# Patient Record
Sex: Male | Born: 1985 | Race: White | Hispanic: No | Marital: Single | Smoking: Current every day smoker
Health system: Southern US, Community
[De-identification: ages and names within clinical notes are randomized; demographics above are authoritative.]

## PROBLEM LIST (undated history)

## (undated) DIAGNOSIS — F419 Anxiety disorder, unspecified: Secondary | ICD-10-CM

## (undated) DIAGNOSIS — K429 Umbilical hernia without obstruction or gangrene: Secondary | ICD-10-CM

---

## 2004-02-19 ENCOUNTER — Emergency Department: Payer: Self-pay | Admitting: Emergency Medicine

## 2004-03-18 ENCOUNTER — Emergency Department: Payer: Self-pay | Admitting: Internal Medicine

## 2004-04-05 ENCOUNTER — Emergency Department: Payer: Self-pay | Admitting: Emergency Medicine

## 2004-06-09 ENCOUNTER — Emergency Department: Payer: Self-pay | Admitting: Emergency Medicine

## 2004-08-10 ENCOUNTER — Emergency Department: Payer: Self-pay | Admitting: Emergency Medicine

## 2004-09-30 ENCOUNTER — Other Ambulatory Visit: Payer: Self-pay

## 2004-09-30 ENCOUNTER — Emergency Department: Payer: Self-pay | Admitting: Unknown Physician Specialty

## 2004-10-01 ENCOUNTER — Other Ambulatory Visit: Payer: Self-pay

## 2004-12-12 ENCOUNTER — Emergency Department: Payer: Self-pay | Admitting: Emergency Medicine

## 2004-12-12 ENCOUNTER — Other Ambulatory Visit: Payer: Self-pay

## 2005-01-27 ENCOUNTER — Emergency Department: Payer: Self-pay | Admitting: Emergency Medicine

## 2005-04-18 ENCOUNTER — Other Ambulatory Visit: Payer: Self-pay

## 2005-04-18 ENCOUNTER — Emergency Department: Payer: Self-pay | Admitting: Emergency Medicine

## 2005-07-12 ENCOUNTER — Emergency Department: Payer: Self-pay | Admitting: Emergency Medicine

## 2005-10-25 ENCOUNTER — Other Ambulatory Visit: Payer: Self-pay

## 2005-10-25 ENCOUNTER — Emergency Department: Payer: Self-pay | Admitting: Emergency Medicine

## 2005-11-19 ENCOUNTER — Emergency Department: Payer: Self-pay | Admitting: Emergency Medicine

## 2005-12-14 ENCOUNTER — Emergency Department: Payer: Self-pay | Admitting: Emergency Medicine

## 2006-07-13 ENCOUNTER — Emergency Department: Payer: Self-pay | Admitting: Emergency Medicine

## 2006-08-19 ENCOUNTER — Ambulatory Visit: Payer: Self-pay | Admitting: Family Medicine

## 2006-09-23 ENCOUNTER — Emergency Department: Payer: Self-pay | Admitting: Internal Medicine

## 2006-09-23 ENCOUNTER — Other Ambulatory Visit: Payer: Self-pay

## 2006-09-29 ENCOUNTER — Ambulatory Visit: Payer: Self-pay | Admitting: Internal Medicine

## 2006-10-03 ENCOUNTER — Other Ambulatory Visit: Payer: Self-pay

## 2006-10-03 ENCOUNTER — Emergency Department: Payer: Self-pay | Admitting: Internal Medicine

## 2006-10-07 ENCOUNTER — Ambulatory Visit: Payer: Self-pay | Admitting: Internal Medicine

## 2006-10-12 ENCOUNTER — Ambulatory Visit: Payer: Self-pay | Admitting: Internal Medicine

## 2006-10-20 ENCOUNTER — Emergency Department: Payer: Self-pay | Admitting: Emergency Medicine

## 2006-10-23 ENCOUNTER — Emergency Department: Payer: Self-pay | Admitting: Emergency Medicine

## 2006-11-19 ENCOUNTER — Emergency Department: Payer: Self-pay

## 2006-12-29 ENCOUNTER — Emergency Department: Payer: Self-pay | Admitting: Internal Medicine

## 2007-01-02 ENCOUNTER — Emergency Department: Payer: Self-pay | Admitting: Emergency Medicine

## 2007-01-11 ENCOUNTER — Emergency Department: Payer: Self-pay | Admitting: Emergency Medicine

## 2007-01-11 ENCOUNTER — Other Ambulatory Visit: Payer: Self-pay

## 2007-01-17 ENCOUNTER — Emergency Department: Payer: Self-pay | Admitting: Emergency Medicine

## 2007-01-20 ENCOUNTER — Emergency Department: Payer: Self-pay | Admitting: Emergency Medicine

## 2007-01-27 ENCOUNTER — Emergency Department: Payer: Self-pay | Admitting: Emergency Medicine

## 2007-01-27 ENCOUNTER — Other Ambulatory Visit: Payer: Self-pay

## 2007-01-29 ENCOUNTER — Emergency Department: Payer: Self-pay | Admitting: Emergency Medicine

## 2007-01-29 ENCOUNTER — Other Ambulatory Visit: Payer: Self-pay

## 2007-02-07 ENCOUNTER — Ambulatory Visit: Payer: Self-pay | Admitting: Family Medicine

## 2007-03-09 ENCOUNTER — Emergency Department: Payer: Self-pay | Admitting: Emergency Medicine

## 2007-03-09 ENCOUNTER — Emergency Department: Payer: Self-pay

## 2007-03-09 ENCOUNTER — Other Ambulatory Visit: Payer: Self-pay

## 2007-04-06 ENCOUNTER — Other Ambulatory Visit: Payer: Self-pay

## 2007-04-06 ENCOUNTER — Emergency Department: Payer: Self-pay | Admitting: Emergency Medicine

## 2007-04-23 ENCOUNTER — Emergency Department: Payer: Self-pay | Admitting: Emergency Medicine

## 2007-05-29 ENCOUNTER — Emergency Department: Payer: Self-pay | Admitting: Emergency Medicine

## 2007-05-29 ENCOUNTER — Other Ambulatory Visit: Payer: Self-pay

## 2007-08-02 ENCOUNTER — Emergency Department: Payer: Self-pay | Admitting: Emergency Medicine

## 2007-11-07 ENCOUNTER — Emergency Department: Payer: Self-pay | Admitting: Emergency Medicine

## 2008-01-25 DIAGNOSIS — B360 Pityriasis versicolor: Secondary | ICD-10-CM

## 2008-01-25 HISTORY — DX: Pityriasis versicolor: B36.0

## 2009-02-20 ENCOUNTER — Emergency Department: Payer: Self-pay | Admitting: Emergency Medicine

## 2009-06-08 ENCOUNTER — Emergency Department: Payer: Self-pay | Admitting: Emergency Medicine

## 2009-06-25 ENCOUNTER — Emergency Department: Payer: Self-pay | Admitting: Emergency Medicine

## 2010-06-03 ENCOUNTER — Emergency Department: Payer: Self-pay | Admitting: Emergency Medicine

## 2011-03-14 ENCOUNTER — Emergency Department: Payer: Self-pay | Admitting: Emergency Medicine

## 2011-06-28 ENCOUNTER — Emergency Department: Payer: Self-pay | Admitting: Emergency Medicine

## 2011-09-09 ENCOUNTER — Emergency Department: Payer: Self-pay | Admitting: *Deleted

## 2011-09-13 ENCOUNTER — Emergency Department: Payer: Self-pay | Admitting: Emergency Medicine

## 2011-12-04 ENCOUNTER — Emergency Department: Payer: Self-pay | Admitting: Emergency Medicine

## 2011-12-12 ENCOUNTER — Emergency Department (HOSPITAL_COMMUNITY)
Admission: EM | Admit: 2011-12-12 | Discharge: 2011-12-12 | Disposition: A | Discharge status: Home / Self Care | Payer: No Typology Code available for payment source | Attending: Emergency Medicine | Admitting: Emergency Medicine

## 2011-12-12 ENCOUNTER — Encounter (HOSPITAL_COMMUNITY): Payer: Self-pay | Admitting: Emergency Medicine

## 2011-12-12 ENCOUNTER — Emergency Department (HOSPITAL_COMMUNITY): Payer: No Typology Code available for payment source

## 2011-12-12 DIAGNOSIS — S62639A Displaced fracture of distal phalanx of unspecified finger, initial encounter for closed fracture: Secondary | ICD-10-CM | POA: Insufficient documentation

## 2011-12-12 DIAGNOSIS — Y9389 Activity, other specified: Secondary | ICD-10-CM | POA: Insufficient documentation

## 2011-12-12 DIAGNOSIS — S0100XA Unspecified open wound of scalp, initial encounter: Secondary | ICD-10-CM | POA: Insufficient documentation

## 2011-12-12 DIAGNOSIS — F17200 Nicotine dependence, unspecified, uncomplicated: Secondary | ICD-10-CM | POA: Insufficient documentation

## 2011-12-12 DIAGNOSIS — IMO0002 Reserved for concepts with insufficient information to code with codable children: Secondary | ICD-10-CM

## 2011-12-12 DIAGNOSIS — Z23 Encounter for immunization: Secondary | ICD-10-CM | POA: Insufficient documentation

## 2011-12-12 HISTORY — DX: Umbilical hernia without obstruction or gangrene: K42.9

## 2011-12-12 HISTORY — DX: Anxiety disorder, unspecified: F41.9

## 2011-12-12 MED ORDER — HYDROCODONE-ACETAMINOPHEN 5-325 MG PO TABS
1.0000 | ORAL_TABLET | Freq: Once | ORAL | Status: DC
  Filled 2011-12-12: qty 1

## 2011-12-12 MED ORDER — TETANUS-DIPHTH-ACELL PERTUSSIS 5-2.5-18.5 LF-MCG/0.5 IM SUSP
0.5000 mL | Freq: Once | INTRAMUSCULAR | Status: AC
  Administered 2011-12-12: 0.5 mL via INTRAMUSCULAR
  Filled 2011-12-12: qty 0.5

## 2011-12-12 MED ORDER — HYDROCODONE-ACETAMINOPHEN 5-325 MG PO TABS: 1.0000 | ORAL_TABLET | Freq: Four times a day (QID) | ORAL | Status: DC | PRN

## 2011-12-12 MED ORDER — IBUPROFEN 800 MG PO TABS
800.0000 mg | ORAL_TABLET | Freq: Once | ORAL | Status: DC
  Filled 2011-12-12: qty 1

## 2011-12-12 MED ORDER — NAPROXEN 500 MG PO TABS: 500.0000 mg | ORAL_TABLET | Freq: Two times a day (BID) | ORAL | Status: DC
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
## 2011-12-12 NOTE — Progress Notes (Signed)
Orthopedic Tech Progress Note Patient Details:  Shawn Leblanc [birth year] [id number]  Ortho Devices Type of Ortho Device: Finger splint Ortho Device/Splint Location: LEFT FINGER SPLINT Ortho Device/Splint Interventions: Application   Cammer, Mickie Bail 12/12/2011, 12:49 PM
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
## 2011-12-15 NOTE — ED Provider Notes (Signed)
Medical screening examination/treatment/procedure(s) were performed by non-physician practitioner and as supervising physician I was immediately available for consultation/collaboration.   Mychal Durio M Cadel Stairs, MD 12/15/11 2343 

## 2011-12-16 ENCOUNTER — Emergency Department: Payer: Self-pay | Admitting: Emergency Medicine

## 2012-04-07 ENCOUNTER — Emergency Department: Payer: Self-pay | Admitting: Emergency Medicine

## 2012-06-25 ENCOUNTER — Emergency Department: Payer: Self-pay | Admitting: Emergency Medicine

## 2012-06-25 LAB — URINALYSIS, COMPLETE
Bacteria: NONE SEEN
Ketone: NEGATIVE
Ph: 6 (ref 4.5–8.0)
RBC,UR: 1 /HPF (ref 0–5)
WBC UR: 1 /HPF (ref 0–5)

## 2012-08-25 ENCOUNTER — Emergency Department: Payer: Self-pay | Admitting: Emergency Medicine

## 2012-09-17 ENCOUNTER — Emergency Department: Payer: Self-pay | Admitting: Emergency Medicine

## 2012-09-17 LAB — BASIC METABOLIC PANEL
Calcium, Total: 9.3 mg/dL (ref 8.5–10.1)
Chloride: 106 mmol/L (ref 98–107)
Co2: 28 mmol/L (ref 21–32)
EGFR (African American): >60
Glucose: 121 mg/dL — ABNORMAL HIGH (ref 65–99)

## 2012-09-17 LAB — TROPONIN I: Troponin-I: <0.02 ng/mL

## 2012-09-17 LAB — CBC
MCH: 31.3 pg (ref 26.0–34.0)
MCHC: 35.5 g/dL (ref 32.0–36.0)
Platelet: 196 10*3/uL (ref 150–440)
WBC: 9.7 10*3/uL (ref 3.8–10.6)

## 2013-02-11 ENCOUNTER — Emergency Department: Payer: Self-pay | Admitting: Emergency Medicine

## 2013-04-21 ENCOUNTER — Emergency Department: Payer: Self-pay | Admitting: Emergency Medicine

## 2013-07-15 ENCOUNTER — Emergency Department: Payer: Self-pay | Admitting: Emergency Medicine

## 2013-07-22 ENCOUNTER — Emergency Department: Payer: Self-pay | Admitting: Emergency Medicine

## 2013-08-01 ENCOUNTER — Emergency Department: Payer: Self-pay | Admitting: Emergency Medicine

## 2013-08-20 ENCOUNTER — Emergency Department: Payer: Self-pay | Admitting: Emergency Medicine

## 2013-11-27 ENCOUNTER — Emergency Department: Payer: Self-pay | Admitting: Emergency Medicine

## 2013-11-27 LAB — CBC
HCT: 47.4 % (ref 40.0–52.0)
HGB: 16.3 g/dL (ref 13.0–18.0)
MCH: 31.4 pg (ref 26.0–34.0)
MCHC: 34.3 g/dL (ref 32.0–36.0)
MCV: 91 fL (ref 80–100)
PLATELETS: 200 10*3/uL (ref 150–440)
RBC: 5.19 10*6/uL (ref 4.40–5.90)
RDW: 13.1 % (ref 11.5–14.5)
WBC: 6.7 10*3/uL (ref 3.8–10.6)

## 2013-11-27 LAB — BASIC METABOLIC PANEL
Anion Gap: 5 — ABNORMAL LOW (ref 7–16)
BUN: 13 mg/dL (ref 7–18)
CALCIUM: 8.9 mg/dL (ref 8.5–10.1)
CHLORIDE: 106 mmol/L (ref 98–107)
CO2: 29 mmol/L (ref 21–32)
Creatinine: 1.2 mg/dL (ref 0.60–1.30)
EGFR (African American): >60
GLUCOSE: 92 mg/dL (ref 65–99)
Osmolality: 279 (ref 275–301)
Potassium: 3.5 mmol/L (ref 3.5–5.1)
Sodium: 140 mmol/L (ref 136–145)

## 2013-11-27 LAB — TROPONIN I

## 2013-12-26 ENCOUNTER — Emergency Department: Payer: Self-pay | Admitting: Emergency Medicine

## 2014-01-22 ENCOUNTER — Emergency Department: Payer: Self-pay | Admitting: Emergency Medicine

## 2014-02-01 ENCOUNTER — Emergency Department: Payer: Self-pay | Admitting: Emergency Medicine

## 2014-02-01 LAB — BASIC METABOLIC PANEL
Anion Gap: 6 — ABNORMAL LOW (ref 7–16)
BUN: 12 mg/dL (ref 7–18)
CALCIUM: 9.1 mg/dL (ref 8.5–10.1)
CO2: 26 mmol/L (ref 21–32)
CREATININE: 1.17 mg/dL (ref 0.60–1.30)
Chloride: 107 mmol/L (ref 98–107)
EGFR (African American): >60
Glucose: 96 mg/dL (ref 65–99)
Osmolality: 277 (ref 275–301)
POTASSIUM: 3.3 mmol/L — AB (ref 3.5–5.1)
SODIUM: 139 mmol/L (ref 136–145)

## 2014-02-01 LAB — CBC
HCT: 44.7 % (ref 40.0–52.0)
HGB: 15.3 g/dL (ref 13.0–18.0)
MCH: 30.3 pg (ref 26.0–34.0)
MCHC: 34.1 g/dL (ref 32.0–36.0)
MCV: 89 fL (ref 80–100)
PLATELETS: 264 10*3/uL (ref 150–440)
RBC: 5.03 10*6/uL (ref 4.40–5.90)
RDW: 13.4 % (ref 11.5–14.5)
WBC: 12.2 10*3/uL — AB (ref 3.8–10.6)

## 2014-02-01 LAB — TROPONIN I

## 2014-02-02 LAB — TROPONIN I

## 2014-02-02 LAB — D-DIMER(ARMC): D-DIMER: 83 ng/mL

## 2014-02-07 ENCOUNTER — Emergency Department: Payer: Self-pay | Admitting: Emergency Medicine

## 2014-03-16 ENCOUNTER — Emergency Department: Payer: Self-pay | Admitting: Emergency Medicine

## 2014-03-27 ENCOUNTER — Emergency Department: Payer: Self-pay | Admitting: Emergency Medicine

## 2014-04-07 ENCOUNTER — Emergency Department: Payer: Self-pay | Admitting: Student

## 2014-04-12 ENCOUNTER — Emergency Department: Payer: Self-pay | Admitting: Emergency Medicine

## 2014-05-03 ENCOUNTER — Emergency Department
Admit: 2014-05-03 | Disposition: A | Discharge status: Home / Self Care | Payer: Self-pay | Admitting: Emergency Medicine

## 2014-05-03 LAB — URINALYSIS, COMPLETE
BLOOD: NEGATIVE
Bacteria: NEGATIVE
Bilirubin,UR: NEGATIVE
Glucose,UR: NEGATIVE mg/dL (ref 0–75)
Ketone: NEGATIVE
LEUKOCYTE ESTERASE: NEGATIVE
NITRITE: NEGATIVE
PH: 6 (ref 4.5–8.0)
Protein: NEGATIVE
Specific Gravity: 1.018 (ref 1.003–1.030)

## 2014-05-03 LAB — COMPREHENSIVE METABOLIC PANEL
Albumin: 4.4 g/dL
Alkaline Phosphatase: 42 U/L
Anion Gap: 5 — ABNORMAL LOW (ref 7–16)
BILIRUBIN TOTAL: 1.3 mg/dL — AB
BUN: 13 mg/dL
CREATININE: 1.09 mg/dL
Calcium, Total: 9.4 mg/dL
Chloride: 105 mmol/L
Co2: 29 mmol/L
GLUCOSE: 84 mg/dL
Potassium: 3.4 mmol/L — ABNORMAL LOW
SGOT(AST): 19 U/L
SGPT (ALT): 13 U/L — ABNORMAL LOW
SODIUM: 139 mmol/L
TOTAL PROTEIN: 7.3 g/dL

## 2014-05-03 LAB — CBC WITH DIFFERENTIAL/PLATELET
BASOS PCT: 0.3 %
Basophil #: 0 10*3/uL (ref 0.0–0.1)
EOS ABS: 0.1 10*3/uL (ref 0.0–0.7)
Eosinophil %: 1.2 %
HCT: 45.9 % (ref 40.0–52.0)
HGB: 15.7 g/dL (ref 13.0–18.0)
LYMPHS PCT: 18.9 %
Lymphocyte #: 1.5 10*3/uL (ref 1.0–3.6)
MCH: 30.6 pg (ref 26.0–34.0)
MCHC: 34.3 g/dL (ref 32.0–36.0)
MCV: 89 fL (ref 80–100)
Monocyte #: 0.6 x10 3/mm (ref 0.2–1.0)
Monocyte %: 7.9 %
NEUTROS ABS: 5.5 10*3/uL (ref 1.4–6.5)
Neutrophil %: 71.7 %
Platelet: 193 10*3/uL (ref 150–440)
RBC: 5.14 10*6/uL (ref 4.40–5.90)
RDW: 13.2 % (ref 11.5–14.5)
WBC: 7.7 10*3/uL (ref 3.8–10.6)

## 2014-09-19 ENCOUNTER — Emergency Department
Admission: EM | Admit: 2014-09-19 | Discharge: 2014-09-19 | Disposition: A | Discharge status: Home / Self Care | Payer: Self-pay | Attending: Emergency Medicine | Admitting: Emergency Medicine

## 2014-09-19 ENCOUNTER — Emergency Department: Payer: Self-pay

## 2014-09-19 ENCOUNTER — Encounter: Payer: Self-pay | Admitting: Emergency Medicine

## 2014-09-19 DIAGNOSIS — R0789 Other chest pain: Secondary | ICD-10-CM | POA: Insufficient documentation

## 2014-09-19 DIAGNOSIS — J0110 Acute frontal sinusitis, unspecified: Secondary | ICD-10-CM | POA: Insufficient documentation

## 2014-09-19 DIAGNOSIS — Z720 Tobacco use: Secondary | ICD-10-CM | POA: Insufficient documentation

## 2014-09-19 DIAGNOSIS — J029 Acute pharyngitis, unspecified: Secondary | ICD-10-CM | POA: Insufficient documentation

## 2014-09-19 DIAGNOSIS — Z791 Long term (current) use of non-steroidal anti-inflammatories (NSAID): Secondary | ICD-10-CM | POA: Insufficient documentation

## 2014-09-19 MED ORDER — AMOXICILLIN 500 MG PO TABS: 500.0000 mg | ORAL_TABLET | Freq: Three times a day (TID) | ORAL | Status: DC

## 2014-09-19 NOTE — ED Provider Notes (Signed)
[facility] Emergency Department Provider Note ____________________________________________  Time seen: Approximately 11:33 AM  I have reviewed the triage vital signs and the nursing notes.   HISTORY  Chief Complaint Sore Throat; Chest Pain; and Facial Pain   HPI Shawn Leblanc is a 29 y.o. male who presents to the emergency department for evaluation of sinus pressure, sore throat, and a "raw feeling" in the right side of his chest when he takes a deep breath or coughs. He states that the sore throat started first and then the sinus pressure and raw feeling followed. He denies fever, nausea, vomiting, or diarrhea. He denies exposure to others who are ill.   Past Medical History  Diagnosis Date  . Anxiety   . Umbilical hernia     There are no active problems to display for this patient.   No past surgical history on file.  Current Outpatient Rx  Name  Route  Sig  Dispense  Refill  . amoxicillin (AMOXIL) 500 MG tablet   Oral   Take 1 tablet (500 mg total) by mouth 3 (three) times daily.   30 tablet   0   . clonazePAM (KLONOPIN) 0.5 MG tablet   Oral   Take 0.5 mg by mouth 2 (two) times daily as needed. For anxiety         . HYDROcodone-acetaminophen (NORCO/VICODIN) 5-325 MG per tablet   Oral   Take 1 tablet by mouth every 6 (six) hours as needed for pain.   15 tablet   0   . naproxen (NAPROSYN) 500 MG tablet   Oral   Take 1 tablet (500 mg total) by mouth 2 (two) times daily.   30 tablet   0     Allergies Review of patient's allergies indicates no known allergies.  No family history on file.  Social History Social History  Substance Use Topics  . Smoking status: Current Every Day Smoker  . Smokeless tobacco: None  . Alcohol Use: No    Review of Systems Constitutional: No fever/chills Eyes: No visual changes. ENT: Positive for sore throat. Positive for sinus pressure on the right side. Cardiovascular: Raw feeling in the  right chest. No palpitations or feeling of irregular heartbeat. Respiratory: Denies shortness of breath. Gastrointestinal: No abdominal pain.  No nausea, no vomiting.  No diarrhea.  No constipation. Genitourinary: Negative for dysuria. Musculoskeletal: Negative for back pain. Skin: Negative for rash. Neurological: Negative for headaches, focal weakness or numbness.  10-point ROS otherwise negative.  ____________________________________________   PHYSICAL EXAM:  VITAL SIGNS: ED Triage Vitals  Enc Vitals Group     BP 09/19/14 1041 130/75 mmHg     Pulse Rate 09/19/14 1041 78     Resp 09/19/14 1041 14     Temp 09/19/14 1041 98 F (36.7 C)     Temp Source 09/19/14 1041 Oral     SpO2 09/19/14 1041 99 %     Weight 09/19/14 1041 180 lb (81.647 kg)     Height 09/19/14 1041 6\' 3"  (1.905 m)     Head Cir --      Peak Flow --      Pain Score 09/19/14 1042 4     Pain Loc --      Pain Edu? --      Excl. in GC? --     Constitutional: Alert and oriented. Well appearing and in no acute distress. Eyes: Conjunctivae are normal. PERRL. EOMI. Head: Atraumatic. Nose: No congestion/rhinnorhea. Tenderness over the  frontal sinus on the right side with percussion. Mouth/Throat: Mucous membranes are moist.  White pustules noted on the right tonsil. No uvular shift or difficulty swallowing. Neck: No stridor.   Hematological/Lymphatic/Immunilogical: No cervical lymphadenopathy. Cardiovascular: Normal rate, regular rhythm. Grossly normal heart sounds.  Good peripheral circulation. Respiratory: Normal respiratory effort.  No retractions. Lungs CTAB. Gastrointestinal: Soft and nontender. No distention. No abdominal bruits. No CVA tenderness. Musculoskeletal: No lower extremity tenderness nor edema.  No joint effusions. Neurologic:  Normal speech and language. No gross focal neurologic deficits are appreciated. No gait instability. Skin:  Skin is warm, dry and intact. No rash noted. Psychiatric: Mood  and affect are normal. Speech and behavior are normal.  ____________________________________________   LABS (all labs ordered are listed, but only abnormal results are displayed)  Labs Reviewed - No data to display ____________________________________________  EKG  Not indicated ____________________________________________  RADIOLOGY  Chest x-ray shows no acute cardiopulmonary abnormality. ____________________________________________   PROCEDURES  Procedure(s) performed: None  Critical Care performed: No  ____________________________________________   INITIAL IMPRESSION / ASSESSMENT AND PLAN / ED COURSE  Pertinent labs & imaging results that were available during my care of the patient were reviewed by me and considered in my medical decision making (see chart for details).  Patient was advised to take the antibiotics until finished. He was advised to follow-up with his primary care provider for symptoms that are not improving over the next 2-3 days. He was advised to return to the emergency department for symptoms that change or worsen if he is unable schedule an appointment. ____________________________________________   FINAL CLINICAL IMPRESSION(S) / ED DIAGNOSES  Final diagnoses:  Acute pharyngitis, unspecified pharyngitis type  Acute frontal sinusitis, recurrence not specified  Chest discomfort        Chinita Pester, FNP 09/19/14 1338  Governor Rooks, MD 09/19/14 1521

## 2014-09-19 NOTE — Discharge Instructions (Signed)

## 2014-09-19 NOTE — ED Notes (Signed)
Says feels raw in throat and now it hurts down into lungs especially after smoking.  Says he also has pain in forehead from sinuses but no runny nose.

## 2014-10-14 ENCOUNTER — Emergency Department: Payer: Self-pay

## 2014-10-14 ENCOUNTER — Emergency Department
Admission: EM | Admit: 2014-10-14 | Discharge: 2014-10-14 | Disposition: A | Discharge status: Home / Self Care | Payer: Self-pay | Attending: Emergency Medicine | Admitting: Emergency Medicine

## 2014-10-14 ENCOUNTER — Encounter: Payer: Self-pay | Admitting: Emergency Medicine

## 2014-10-14 DIAGNOSIS — K029 Dental caries, unspecified: Secondary | ICD-10-CM | POA: Insufficient documentation

## 2014-10-14 DIAGNOSIS — K047 Periapical abscess without sinus: Secondary | ICD-10-CM | POA: Insufficient documentation

## 2014-10-14 DIAGNOSIS — J40 Bronchitis, not specified as acute or chronic: Secondary | ICD-10-CM | POA: Insufficient documentation

## 2014-10-14 DIAGNOSIS — Z720 Tobacco use: Secondary | ICD-10-CM | POA: Insufficient documentation

## 2014-10-14 DIAGNOSIS — Z791 Long term (current) use of non-steroidal anti-inflammatories (NSAID): Secondary | ICD-10-CM | POA: Insufficient documentation

## 2014-10-14 DIAGNOSIS — Z792 Long term (current) use of antibiotics: Secondary | ICD-10-CM | POA: Insufficient documentation

## 2014-10-14 LAB — BASIC METABOLIC PANEL
ANION GAP: 6 (ref 5–15)
BUN: 13 mg/dL (ref 6–20)
CALCIUM: 9.5 mg/dL (ref 8.9–10.3)
CO2: 28 mmol/L (ref 22–32)
Chloride: 102 mmol/L (ref 101–111)
Creatinine, Ser: 1.17 mg/dL (ref 0.61–1.24)
Glucose, Bld: 93 mg/dL (ref 65–99)
POTASSIUM: 3.7 mmol/L (ref 3.5–5.1)
SODIUM: 136 mmol/L (ref 135–145)

## 2014-10-14 LAB — CBC WITH DIFFERENTIAL/PLATELET
BASOS ABS: 0.1 10*3/uL (ref 0–0.1)
BASOS PCT: 1 %
EOS PCT: 3 %
Eosinophils Absolute: 0.2 10*3/uL (ref 0–0.7)
HCT: 45.3 % (ref 40.0–52.0)
Hemoglobin: 15.8 g/dL (ref 13.0–18.0)
LYMPHS PCT: 24 %
Lymphs Abs: 2 10*3/uL (ref 1.0–3.6)
MCH: 31.2 pg (ref 26.0–34.0)
MCHC: 34.8 g/dL (ref 32.0–36.0)
MCV: 89.7 fL (ref 80.0–100.0)
MONO ABS: 0.7 10*3/uL (ref 0.2–1.0)
Monocytes Relative: 8 %
Neutro Abs: 5.4 10*3/uL (ref 1.4–6.5)
Neutrophils Relative %: 64 %
PLATELETS: 186 10*3/uL (ref 150–440)
RBC: 5.05 MIL/uL (ref 4.40–5.90)
RDW: 13.3 % (ref 11.5–14.5)
WBC: 8.3 10*3/uL (ref 3.8–10.6)

## 2014-10-14 MED ORDER — DEXAMETHASONE SODIUM PHOSPHATE 10 MG/ML IJ SOLN
10.0000 mg | Freq: Once | INTRAMUSCULAR | Status: DC
  Filled 2014-10-14: qty 1

## 2014-10-14 MED ORDER — IOHEXOL 300 MG/ML  SOLN
100.0000 mL | Freq: Once | INTRAMUSCULAR | Status: AC | PRN
  Administered 2014-10-14: 100 mL via INTRAVENOUS

## 2014-10-14 MED ORDER — SODIUM CHLORIDE 0.9 % IV BOLUS (SEPSIS)
1000.0000 mL | INTRAVENOUS | Status: AC
  Administered 2014-10-14: 1000 mL via INTRAVENOUS

## 2014-10-14 NOTE — Discharge Instructions (Signed)
As we discussed, we believe you are symptoms or cause by bronchitis and/or the environmental exposures from your workplace.  We recommend that you stop smoking and [facility].  Return to the emergency department if he develop new or worsening symptoms that concern you.  At this time there is no specific treatment that will help you immediately although you were given a dose of Decadron 10 mg which is a steroids should help with the inflammation of your airways.  We are also providing information about local dental options given the multiple cavities and small long-term infections that were seen.  Only a dentist can help you with these issues.   Upper Respiratory Infection, Adult An upper respiratory infection (URI) is also known as the common cold. It is often caused by a type of germ (virus). Colds are easily spread (contagious). You can pass it to others by kissing, coughing, sneezing, or drinking out of the same glass. Usually, you get better in 1 or 2 weeks.  HOME CARE   Only take medicine as told by your doctor.  Use a warm mist humidifier or breathe in steam from a hot shower.  Drink enough water and fluids to keep your pee (urine) clear or pale yellow.  Get plenty of rest.  Return to work when your temperature is back to normal or as told by your doctor. You may use a face mask and wash your hands to stop your cold from spreading. GET HELP RIGHT AWAY IF:   After the first few days, you feel you are getting worse.  You have questions about your medicine.  You have chills, shortness of breath, or brown or red spit (mucus).  You have yellow or brown snot (nasal discharge) or pain in the face, especially when you bend forward.  You have a fever, puffy (swollen) neck, pain when you swallow, or white spots in the back of your throat.  You have a bad headache, ear pain, sinus pain, or chest pain.  You have a high-pitched  whistling sound when you breathe in and out (wheezing).  You have a lasting cough or cough up blood.  You have sore muscles or a stiff neck. MAKE SURE YOU:   Understand these instructions.  Will watch your condition.  Will get help right away if you are not doing well or get worse. Document Released: 06/30/2007 Document Revised: 04/05/2011 Document Reviewed: 04/18/2013 [facility] Patient Information 2015 [place], [place], [place]. This information is not intended to replace advice given to you by your health care provider. Make sure you discuss any questions you have with your health care provider.  Dental Caries Dental caries (also called tooth decay) is the most common oral disease. It can occur at any age but is more common in children and young adults.  HOW DENTAL CARIES DEVELOPS  The process of decay begins when bacteria and foods (particularly sugars and starches) combine in your mouth to produce plaque. Plaque is a substance that sticks to the hard, outer surface of a tooth (enamel). The bacteria in plaque produce acids that attack enamel. These acids may also attack the root surface of a tooth (cementum) if it is exposed. Repeated attacks dissolve these surfaces and create holes in the tooth (cavities). If left untreated, the acids destroy the other layers of the tooth.  RISK FACTORS  Frequent sipping of sugary beverages.   Frequent snacking on sugary and starchy foods, especially those that easily  get stuck in the teeth.   Poor oral hygiene.   Dry mouth.   Substance abuse such as methamphetamine abuse.   Broken or poor-fitting dental restorations.   Eating disorders.   Gastroesophageal reflux disease (GERD).   Certain radiation treatments to the head and neck. SYMPTOMS In the early stages of dental caries, symptoms are seldom present. Sometimes white, chalky areas may be seen on the enamel or other tooth layers. In later stages, symptoms may include:  Pits and holes on  the enamel.  Toothache after sweet, hot, or cold foods or drinks are consumed.  Pain around the tooth.  Swelling around the tooth. DIAGNOSIS  Most of the time, dental caries is detected during a regular dental checkup. A diagnosis is made after a thorough medical and dental history is taken and the surfaces of your teeth are checked for signs of dental caries. Sometimes special instruments, such as lasers, are used to check for dental caries. Dental X-ray exams may be taken so that areas not visible to the eye (such as between the contact areas of the teeth) can be checked for cavities.  TREATMENT  If dental caries is in its early stages, it may be reversed with a fluoride treatment or an application of a remineralizing agent at the dental office. Thorough brushing and flossing at home is needed to aid these treatments. If it is in its later stages, treatment depends on the location and extent of tooth destruction:   If a small area of the tooth has been destroyed, the destroyed area will be removed and cavities will be filled with a material such as gold, silver amalgam, or composite resin.   If a large area of the tooth has been destroyed, the destroyed area will be removed and a cap (crown) will be fitted over the remaining tooth structure.   If the center part of the tooth (pulp) is affected, a procedure called a root canal will be needed before a filling or crown can be placed.   If most of the tooth has been destroyed, the tooth may need to be pulled (extracted). HOME CARE INSTRUCTIONS You can prevent, stop, or reverse dental caries at home by practicing good oral hygiene. Good oral hygiene includes:  Thoroughly cleaning your teeth at least twice a day with a toothbrush and dental floss.   Using a fluoride toothpaste. A fluoride mouth rinse may also be used if recommended by your dentist or health care provider.   Restricting the amount of sugary and starchy foods and sugary  liquids you consume.   Avoiding frequent snacking on these foods and sipping of these liquids.   Keeping regular visits with a dentist for checkups and cleanings. PREVENTION   Practice good oral hygiene.  Consider a dental sealant. A dental sealant is a coating material that is applied by your dentist to the pits and grooves of teeth. The sealant prevents food from being trapped in them. It may protect the teeth for several years.  Ask about fluoride supplements if you live in a community without fluorinated water or with water that has a low fluoride content. Use fluoride supplements as directed by your dentist or health care provider.  Allow fluoride varnish applications to teeth if directed by your dentist or health care provider. Document Released: 10/03/2001 Document Revised: 05/28/2013 Document Reviewed: 01/14/2012 [facility] Patient Information 2015 [place], [place]. This information is not intended to replace advice given to you by your health care provider. Make sure you discuss  any questions you have with your health care provider. OPTIONS FOR DENTAL FOLLOW UP CARE  [place] Department of Health and Human Services - Local Safety Net Dental Clinics [email or web address]   [facility] [phone])  Sharl Ma [phone])  [place] [phone])  [facility] [phone])  [facility] [phone]) This clinic caters to the indigent population and is on a lottery system. Location: [employer] of Dentistry, [employer], [address], [place] Clinic Hours: Wednesdays from 6pm - 9pm, patients seen by a lottery system. For dates, call or go to [email or web address] Services: Cleanings, fillings and simple extractions. Payment Options: DENTAL WORK IS FREE OF CHARGE. Bring proof of income or support. Best way to get seen: Arrive at  5:15 pm - this is a lottery, NOT first come/first serve, so arriving earlier will not increase your chances of being seen.     [facility] [phone] Select option 1 for emergencies   Location: [facility] of Dentistry, [place], [address] [place] Clinic Hours: No walk-ins accepted - call the day before to schedule an appointment. Check in times are 9:30 am and 1:30 pm. Services: Simple extractions, temporary fillings, pulpectomy/pulp debridement, uncomplicated abscess drainage. Payment Options: PAYMENT IS DUE AT THE TIME OF SERVICE.  Fee is usually $100-200, additional surgical procedures (e.g. abscess drainage) may be extra. Cash, checks, Visa/MasterCard accepted.  Can file Medicaid if patient is covered for dental - patient should call case worker to check. No discount for [facility] patients. Best way to get seen: MUST call the day before and get onto the schedule. Can usually be seen the next 1-2 days. No walk-ins accepted.     [facility] [phone]   Location: [facility], [address], [place] Clinic Hours: M, W, Th, F 8am or 1:30pm, Tues 9a or 1:30 - first come/first served. Services: Simple extractions, temporary fillings, uncomplicated abscess drainage.  You do not need to be an [facility] resident. Payment Options: PAYMENT IS DUE AT THE TIME OF SERVICE. Dental insurance, otherwise sliding scale - bring proof of income or support. Depending on income and treatment needed, cost is usually $50-200. Best way to get seen: Arrive early as it is first come/first served.     [facility] [phone]   Location: [address] Clinic Hours: Mon-Thu 8a-5p Services: Most basic dental services including extractions and fillings. Payment Options: PAYMENT IS DUE AT THE TIME OF SERVICE. Sliding scale, up to 50% off - bring proof if  income or support. Medicaid with dental option accepted. Best way to get seen: Call to schedule an appointment, can usually be seen within 2 weeks OR they will try to see walk-ins - show up at 8a or 2p (you may have to wait).     [facility] [phone] [place] RESIDENTS ONLY   Location: [facility], [address] [place], [place] [address] Clinic Hours: By appointment only. Monday - Thursday 8am-5pm, Friday 8am-12pm Services: Cleanings, fillings, extractions. Payment Options: PAYMENT IS DUE AT THE TIME OF SERVICE. Cash, Visa or MasterCard. Sliding scale - $30 minimum per service. Best way to get seen: Come in to office, complete packet and make an appointment - need proof of income or support monies for each household member and proof of [facility] residence. Usually takes about a month to get in.     [facility]  Services Dental Clinic [phone]   Location: [address], [facility] Hours: Walk-in Urgent Care Dental Services are offered Monday-Friday mornings only. The numbers of emergencies accepted daily is limited to the number of providers available. Maximum 15 - Mondays, Wednesdays & Thursdays Maximum 10 - Tuesdays & Fridays Services: You do not need to be a [facility] resident to be seen for a dental emergency. Emergencies are defined as pain, swelling, abnormal bleeding, or dental trauma. Walkins will receive x-rays if needed. NOTE: Dental cleaning is not an emergency. Payment Options: PAYMENT IS DUE AT THE TIME OF SERVICE. Minimum co-pay is $40.00 for uninsured patients. Minimum co-pay is $3.00 for Medicaid with dental coverage. Dental Insurance is accepted and must be presented at time of visit. Medicare does not cover dental. Forms of payment: Cash, credit card, checks. Best way to get seen: If not previously registered with the clinic, walk-in dental registration begins at 7:15 am and is on a  first come/first serve basis. If previously registered with the clinic, call to make an appointment.     [facility] [phone] [place] RESIDENTS ONLY   Location: [address] [place], [place] Clinic Hours: Mon-Thu 10a-2p Services: Extractions only! Payment Options: FREE (donations accepted) - bring proof of income or support Best way to get seen: Call and schedule an appointment OR come at 8am on the 1st Monday of every month (except for holidays) when it is first come/first served.     [facility] [phone]   Location: [address] [place], [place] Clinic Hours: Friday mornings Services, Payment Options, Best way to get seen: Call for info

## 2014-10-14 NOTE — ED Notes (Signed)
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

## 2014-10-14 NOTE — ED Provider Notes (Signed)
[facility] Emergency Department Provider Note  ____________________________________________  Time seen: Approximately 4:47 PM  I have reviewed the triage vital signs and the nursing notes.   HISTORY  Chief Complaint throat pressure     HPI Shawn Leblanc is a 29 y.o. male with past medical history of anxiety and tobacco abuse who presents with discomfort in his throat and the upper right side of his chest that he describes as "my airway".  He was seen 3-4 weeks ago in the emergency department and diagnosed with acute pharyngitis and sinusitis.  He reports that the symptoms have continued in spite of taking amoxicillin.  Sometimes he feels like "my airway is closing".  He states the symptoms are worse after he works at his [employer] during the day.  He sometimes has a cough productive of clear or white sputum.  He denies fever/chills.  The shortness of breath is intermittent.  The chest discomfort he describes as tightness and located in the anterior superior medial aspect of the right side of his chest.  He further describes it as a "raw" feeling in his neck, "airway", and upper chest.  He is not having any difficulty swallowing and denies nausea/vomiting and abdominal pain.    Past Medical History  Diagnosis Date  . Anxiety   . Umbilical hernia     There are no active problems to display for this patient.   History reviewed. No pertinent past surgical history.  Current Outpatient Rx  Name  Route  Sig  Dispense  Refill  . amoxicillin (AMOXIL) 500 MG tablet   Oral   Take 1 tablet (500 mg total) by mouth 3 (three) times daily.   30 tablet   0   . clonazePAM (KLONOPIN) 0.5 MG tablet   Oral   Take 0.5 mg by mouth 2 (two) times daily as needed. For anxiety         . HYDROcodone-acetaminophen (NORCO/VICODIN) 5-325 MG per tablet   Oral   Take 1 tablet by mouth every 6 (six) hours as needed for pain.   15 tablet   0   . naproxen  (NAPROSYN) 500 MG tablet   Oral   Take 1 tablet (500 mg total) by mouth 2 (two) times daily.   30 tablet   0     Allergies Review of patient's allergies indicates no known allergies.  No family history on file.  Social History Social History  Substance Use Topics  . Smoking status: Current Every Day Smoker  . Smokeless tobacco: None  . Alcohol Use: No    Review of Systems Constitutional: No fever/chills Eyes: No visual changes. ENT: No sore throat but having "airway problems" Cardiovascular: Sharp chest pain in the anterior superior right side of his chest Respiratory: Occasional shortness of breath associated with the discomfort in his throat and upper chest Gastrointestinal: No abdominal pain.  nausea, no vomiting.  No diarrhea.  No constipation. Genitourinary: Negative for dysuria. Musculoskeletal: Negative for back pain. Skin: Negative for rash. Neurological: Negative for headaches, focal weakness or numbness.  10-point ROS otherwise negative.  ____________________________________________   PHYSICAL EXAM:  VITAL SIGNS: ED Triage Vitals  Enc Vitals Group     BP 10/14/14 1509 140/71 mmHg     Pulse Rate 10/14/14 1509 71     Resp 10/14/14 1509 18     Temp 10/14/14 1509 98.5 F (36.9 C)     Temp Source 10/14/14 1509 Oral     SpO2 10/14/14  1509 99 %     Weight 10/14/14 1508 180 lb (81.647 kg)     Height 10/14/14 1508  (1.905 m)     Head Cir --      Peak Flow --      Pain Score 10/14/14 1508 6     Pain Loc --      Pain Edu? --      Excl. in GC? --     Constitutional: Alert and oriented. Well appearing and in no acute distress. Eyes: Conjunctivae are normal. PERRL. EOMI. Head: Atraumatic. Nose: No congestion/rhinnorhea. Mouth/Throat: Mucous membranes are moist.  Oropharynx non-erythematous.  Cryptic tonsils with several tonsilloliths.  No evidence of peritonsillar abscess.  No petechiae on the palate. Neck: No stridor.  No tenderness to palpation  externally. Hematological/Lymphatic/Immunilogical: No cervical lymphadenopathy. Cardiovascular: Normal rate, regular rhythm. Grossly normal heart sounds.  Good peripheral circulation. Respiratory: Normal respiratory effort.  No retractions. Lungs CTAB. Gastrointestinal: Soft and nontender. No distention. No abdominal bruits. No CVA tenderness. Musculoskeletal: No lower extremity tenderness nor edema.  No joint effusions. Neurologic:  Normal speech and language. No gross focal neurologic deficits are appreciated.  Skin:  Skin is warm, dry and intact. No rash noted. Psychiatric: Mood and affect are normal. Speech and behavior are normal.  ____________________________________________   LABS (all labs ordered are listed, but only abnormal results are displayed)  Labs Reviewed  CBC WITH DIFFERENTIAL/PLATELET  BASIC METABOLIC PANEL   ____________________________________________  EKG  ED ECG REPORT I, FORBACH, CORY, the attending physician, personally viewed and interpreted this ECG.  Date: 10/14/2014 EKG Time: 15:07 Rate: 61 Rhythm: normal sinus rhythm QRS Axis: normal Intervals: Incomplete right bundle branch block ST/T Wave abnormalities: normal Conduction Disutrbances: none Narrative Interpretation: Tall and peaked T waves particularly in V4.  ____________________________________________  RADIOLOGY   Ct Soft Tissue Neck W Contrast  10/14/2014   CLINICAL DATA:  29 year old male with right neck pain and pressure for 2 weeks.  EXAM: CT NECK WITH CONTRAST  TECHNIQUE: Multidetector CT imaging of the neck was performed using the standard protocol following the bolus administration of intravenous contrast.  CONTRAST:  OMNIPAQUE IOHEXOL 300 MG/ML  SOLN  COMPARISON:  12/16/2011 head CT  FINDINGS: Pharynx and larynx: Unremarkable. No evidence of focal thickening, mass or abscess.  Salivary glands: Unremarkable  Thyroid: Unremarkable  Lymph nodes: No enlarged or abnormal appearing  lymph nodes.  Vascular: Unremarkable.  Limited intracranial: Unremarkable  Visualized orbits: Unremarkable  Mastoids and visualized paranasal sinuses: Unremarkable  Skeleton: Scattered caries in several teeth identified. Periapical abscesses of teeth #1, 16, 18 and 31  Upper chest: Unremarkable.  IMPRESSION: No acute abnormalities.  Dental caries and periapical abscesses as described.   Electronically Signed   By: Harmon Pier M.D.   On: 10/14/2014 19:22   Ct Chest W Contrast  10/14/2014   CLINICAL DATA:  Intermittent pressure in throat and right-sided neck. Diagnosed with pharyngitis a couple weeks ago with similar symptoms. Completed antibiotic course.  EXAM: CT CHEST WITH CONTRAST  TECHNIQUE: Multidetector CT imaging of the chest was performed during intravenous contrast administration.  CONTRAST:  OMNIPAQUE IOHEXOL 300 MG/ML  SOLN  COMPARISON:  CT 01/11/2007 and chest x-ray 09/19/2014  FINDINGS: Lungs are adequately inflated and otherwise clear. Airways are normal.  Heart is normal in size there is no mediastinal, hilar or axillary adenopathy. Remaining mediastinal structures are within normal.  Images through the upper abdomen are within normal. Remaining bones soft tissues are unchanged with  subtle curvature of the thoracic spine convex right.  IMPRESSION: No acute cardiopulmonary disease.   Electronically Signed   By: Elberta Fortis M.D.   On: 10/14/2014 19:11    ____________________________________________   PROCEDURES  Procedure(s) performed: None  Critical Care performed: No ____________________________________________   INITIAL IMPRESSION / ASSESSMENT AND PLAN / ED COURSE  Pertinent labs & imaging results that were available during my care of the patient were reviewed by me and considered in my medical decision making (see chart for details).  The patient is generally well-appearing but I am concerned about his description of persistent and possibly worsening symptoms in his  neck, throat, and upper chest in the last couple of weeks.  Given the possibility of an infectious process is tracking down from his neck into his chest, I will evaluate with a CT with IV contrast of his neck and chest.  I am also checking basic labs, particularly in the setting of his peaked T waves.  ----------------------------------------- 7:42 PM on 10/14/2014 -----------------------------------------  Other than dental caries and periapical abscesses, the patient's CT scans are unremarkable unfortunately he does not have any soft tissue infection extending down from his neck into his chest.  The patient remains stable in no acute distress in his room.  I gave him a dose of Decadron 10 mg IV and counseled him regarding smoking, bronchitis, and the environmental factors that may be contributed to his discomfort.  I am going to give [facility] and I also recommended that he see a local dentist for his multiple dental issues.  He just completed a course of amoxicillin side and does not believe it would be in his best interest to continue him on the antibiotics; he supinates to follow up with dental care.  I gave him my usual and customary return precautions. ____________________________________________  FINAL CLINICAL IMPRESSION(S) / ED DIAGNOSES  Final diagnoses:  Bronchitis  Dental caries  Periapical abscess without sinus tract      NEW MEDICATIONS STARTED DURING THIS VISIT:  New Prescriptions   No medications on file     Loleta Rose, MD 10/14/14 1948

## 2014-11-19 ENCOUNTER — Encounter: Payer: Self-pay | Admitting: Emergency Medicine

## 2014-11-19 ENCOUNTER — Emergency Department
Admission: EM | Admit: 2014-11-19 | Discharge: 2014-11-19 | Disposition: A | Discharge status: Home / Self Care | Payer: Self-pay | Attending: Emergency Medicine | Admitting: Emergency Medicine

## 2014-11-19 ENCOUNTER — Emergency Department: Payer: Self-pay

## 2014-11-19 DIAGNOSIS — Z720 Tobacco use: Secondary | ICD-10-CM | POA: Insufficient documentation

## 2014-11-19 DIAGNOSIS — Z792 Long term (current) use of antibiotics: Secondary | ICD-10-CM | POA: Insufficient documentation

## 2014-11-19 DIAGNOSIS — J4520 Mild intermittent asthma, uncomplicated: Secondary | ICD-10-CM

## 2014-11-19 DIAGNOSIS — J039 Acute tonsillitis, unspecified: Secondary | ICD-10-CM | POA: Insufficient documentation

## 2014-11-19 DIAGNOSIS — J45901 Unspecified asthma with (acute) exacerbation: Secondary | ICD-10-CM | POA: Insufficient documentation

## 2014-11-19 MED ORDER — AMOXICILLIN 500 MG PO TABS: 500.0000 mg | ORAL_TABLET | Freq: Two times a day (BID) | ORAL | Status: DC

## 2014-11-19 MED ORDER — AZITHROMYCIN 250 MG PO TABS: ORAL_TABLET | ORAL | Status: DC

## 2014-11-19 MED ORDER — ALBUTEROL SULFATE HFA 108 (90 BASE) MCG/ACT IN AERS: 2.0000 | INHALATION_SPRAY | Freq: Four times a day (QID) | RESPIRATORY_TRACT | Status: DC | PRN

## 2014-11-19 NOTE — Discharge Instructions (Signed)
How to Use an Inhaler Proper inhaler technique is very important. Good technique ensures that the medicine reaches the lungs. Poor technique results in depositing the medicine on the tongue and back of the throat rather than in the airways. If you do not use the inhaler with good technique, the medicine will not help you. STEPS TO FOLLOW IF USING AN INHALER WITHOUT AN EXTENSION TUBE  Remove the cap from the inhaler.  If you are using the inhaler for the first time, you will need to prime it. Shake the inhaler for 5 seconds and release four puffs into the air, away from your face. Ask your health care provider or pharmacist if you have questions about priming your inhaler.  Shake the inhaler for 5 seconds before each breath in (inhalation).  Position the inhaler so that the top of the canister faces up.  Put your index finger on the top of the medicine canister. Your thumb supports the bottom of the inhaler.  Open your mouth.  Either place the inhaler between your teeth and place your lips tightly around the mouthpiece, or hold the inhaler 1-2 inches away from your open mouth. If you are unsure of which technique to use, ask your health care provider.  Breathe out (exhale) normally and as completely as possible.  Press the canister down with your index finger to release the medicine.  At the same time as the canister is pressed, inhale deeply and slowly until your lungs are completely filled. This should take 4-6 seconds. Keep your tongue down.  Hold the medicine in your lungs for 5-10 seconds (10 seconds is best). This helps the medicine get into the small airways of your lungs.  Breathe out slowly, through pursed lips. Whistling is an example of pursed lips.  Wait at least 15-30 seconds between puffs. Continue with the above steps until you have taken the number of puffs your health care provider has ordered. Do not use the inhaler more than your health care provider tells  you.  Replace the cap on the inhaler.  Follow the directions from your health care provider or the inhaler insert for cleaning the inhaler. STEPS TO FOLLOW IF USING AN INHALER WITH AN EXTENSION (SPACER)  Remove the cap from the inhaler.  If you are using the inhaler for the first time, you will need to prime it. Shake the inhaler for 5 seconds and release four puffs into the air, away from your face. Ask your health care provider or pharmacist if you have questions about priming your inhaler.  Shake the inhaler for 5 seconds before each breath in (inhalation).  Place the open end of the spacer onto the mouthpiece of the inhaler.  Position the inhaler so that the top of the canister faces up and the spacer mouthpiece faces you.  Put your index finger on the top of the medicine canister. Your thumb supports the bottom of the inhaler and the spacer.  Breathe out (exhale) normally and as completely as possible.  Immediately after exhaling, place the spacer between your teeth and into your mouth. Close your lips tightly around the spacer.  Press the canister down with your index finger to release the medicine.  At the same time as the canister is pressed, inhale deeply and slowly until your lungs are completely filled. This should take 4-6 seconds. Keep your tongue down and out of the way.  Hold the medicine in your lungs for 5-10 seconds (10 seconds is best). This helps the  medicine get into the small airways of your lungs. Exhale.  Repeat inhaling deeply through the spacer mouthpiece. Again hold that breath for up to 10 seconds (10 seconds is best). Exhale slowly. If it is difficult to take this second deep breath through the spacer, breathe normally several times through the spacer. Remove the spacer from your mouth.  Wait at least 15-30 seconds between puffs. Continue with the above steps until you have taken the number of puffs your health care provider has ordered. Do not use the  inhaler more than your health care provider tells you.  Remove the spacer from the inhaler, and place the cap on the inhaler.  Follow the directions from your health care provider or the inhaler insert for cleaning the inhaler and spacer. If you are using different kinds of inhalers, use your quick relief medicine to open the airways 10-15 minutes before using a steroid if instructed to do so by your health care provider. If you are unsure which inhalers to use and the order of using them, ask your health care provider, nurse, or respiratory therapist. If you are using a steroid inhaler, always rinse your mouth with water after your last puff, then gargle and spit out the water. Do not swallow the water. AVOID:  Inhaling before or after starting the spray of medicine. It takes practice to coordinate your breathing with triggering the spray.  Inhaling through the nose (rather than the mouth) when triggering the spray. HOW TO DETERMINE IF YOUR INHALER IS FULL OR NEARLY EMPTY You cannot know when an inhaler is empty by shaking it. A few inhalers are now being made with dose counters. Ask your health care provider for a prescription that has a dose counter if you feel you need that extra help. If your inhaler does not have a counter, ask your health care provider to help you determine the date you need to refill your inhaler. Write the refill date on a calendar or your inhaler canister. Refill your inhaler 7-10 days before it runs out. Be sure to keep an adequate supply of medicine. This includes making sure it is not expired, and that you have a spare inhaler.  SEEK MEDICAL CARE IF:   Your symptoms are only partially relieved with your inhaler.  You are having trouble using your inhaler.  You have some increase in phlegm. SEEK IMMEDIATE MEDICAL CARE IF:   You feel little or no relief with your inhalers. You are still wheezing and are feeling shortness of breath or tightness in your chest or  both.  You have dizziness, headaches, or a fast heart rate.  You have chills, fever, or night sweats.  You have a noticeable increase in phlegm production, or there is blood in the phlegm. MAKE SURE YOU:   Understand these instructions.  Will watch your condition.  Will get help right away if you are not doing well or get worse.   This information is not intended to replace advice given to you by your health care provider. Make sure you discuss any questions you have with your health care provider.   Document Released: 01/09/2000 Document Revised: 11/01/2012 Document Reviewed: 08/10/2012 Elsevier Interactive Patient Education 2016 [employer]  Tonsillitis Tonsillitis is an infection of the throat. This infection causes the tonsils to become red, tender, and puffy (swollen). Tonsils are groups of tissue at the back of your throat. If bacteria caused your infection, antibiotic medicine will be given to you. Sometimes symptoms of tonsillitis can  be relieved with the use of steroid medicine. If your tonsillitis is severe and happens often, you may need to get your tonsils removed (tonsillectomy). HOME CARE   Rest and sleep often.  Drink enough fluids to keep your pee (urine) clear or pale yellow.  While your throat is sore, eat soft or liquid foods like:  Soup.  Ice cream.  Instant breakfast drinks.  Eat frozen ice pops.  Gargle with a warm or cold liquid to help soothe the throat. Gargle with a water and salt mix. Mix 1/4 teaspoon of salt and 1/4 teaspoon of baking soda in 1 cup of water.  Only take medicines as told by your doctor.  If you are given medicines (antibiotics), take them as told. Finish them even if you start to feel better. GET HELP IF:  You have large, tender lumps in your neck.  You have a rash.  You cough up green, yellow-brown, or bloody fluid.  You cannot swallow liquids or food for 24 hours.  You notice that only one of your tonsils is  swollen. GET HELP RIGHT AWAY IF:   You throw up (vomit).  You have a very bad headache.  You have a stiff neck.  You have chest pain.  You have trouble breathing or swallowing.  You have bad throat pain, drooling, or your voice changes.  You have bad pain not helped by medicine.  You cannot fully open your mouth.  You have redness, puffiness, or bad pain in the neck.  You have a fever. MAKE SURE YOU:   Understand these instructions.  Will watch your condition.  Will get help right away if you are not doing well or get worse.   This information is not intended to replace advice given to you by your health care provider. Make sure you discuss any questions you have with your health care provider.   Document Released: 06/30/2007 Document Revised: 01/16/2013 Document Reviewed: 06/30/2012 Elsevier Interactive Patient Education [employer]

## 2014-11-19 NOTE — ED Provider Notes (Signed)
[facility] Emergency Department Provider Note  ____________________________________________  Time seen: Approximately 1:27 PM  I have reviewed the triage vital signs and the nursing notes.   HISTORY  Chief Complaint Sore Throat    HPI Shawn Leblanc is a 29 y.o. male presents for evaluation of wheezing and short throat. Patient states that he is a smoker and has noticed these symptoms for the last several days. Denies any difficulty swallowing. Feels like sometimes it is hard to get a deep breath.   Past Medical History  Diagnosis Date  . Anxiety   . Umbilical hernia     There are no active problems to display for this patient.   History reviewed. No pertinent past surgical history.  Current Outpatient Rx  Name  Route  Sig  Dispense  Refill  . albuterol (PROVENTIL HFA;VENTOLIN HFA) 108 (90 BASE) MCG/ACT inhaler   Inhalation   Inhale 2 puffs into the lungs every 6 (six) hours as needed for wheezing or shortness of breath.   1 Inhaler   2   . azithromycin (ZITHROMAX Z-PAK) 250 MG tablet      Take 2 tablets (500 mg) on  Day 1,  followed by 1 tablet (250 mg) once daily on Days 2 through 5.   6 each   0   . clonazePAM (KLONOPIN) 0.5 MG tablet   Oral   Take 0.5 mg by mouth 2 (two) times daily as needed. For anxiety           Allergies Review of patient's allergies indicates no known allergies.  History reviewed. No pertinent family history.  Social History Social History  Substance Use Topics  . Smoking status: Current Every Day Smoker -- 0.50 packs/day    Types: Cigarettes  . Smokeless tobacco: None  . Alcohol Use: No    Review of Systems Constitutional: No fever/chills Eyes: No visual changes. ENT: Positive sore throat Cardiovascular: Denies chest pain. Respiratory: Occasional shortness of breath Gastrointestinal: No abdominal pain.  No nausea, no vomiting.  No diarrhea.  No constipation. Genitourinary: Negative for  dysuria. Musculoskeletal: Negative for back pain. Skin: Negative for rash. Neurological: Negative for headaches, focal weakness or numbness.  10-point ROS otherwise negative.  ____________________________________________   PHYSICAL EXAM:  VITAL SIGNS: ED Triage Vitals  Enc Vitals Group     BP 11/19/14 1230 145/67 mmHg     Pulse Rate 11/19/14 1230 83     Resp --      Temp 11/19/14 1230 97.8 F (36.6 C)     Temp Source 11/19/14 1230 Oral     SpO2 11/19/14 1230 100 %     Weight 11/19/14 1230 180 lb (81.647 kg)     Height 11/19/14 1230  (1.905 m)     Head Cir --      Peak Flow --      Pain Score 11/19/14 1233 4     Pain Loc --      Pain Edu? --      Excl. in GC? --     Constitutional: Alert and oriented. Well appearing and in no acute distress. Eyes: Conjunctivae are normal. PERRL. EOMI. Head: Atraumatic. Nose: No congestion/rhinnorhea. Mouth/Throat: Mucous membranes are moist.  Oropharynx non-erythematous. 1+ tonsillar edema with exudate on the right side.  Cardiovascular: Normal rate, regular rhythm. Grossly normal heart sounds.  Good peripheral circulation. Respiratory: Normal respiratory effort.  No retractions. Lungs CTAB. Gastrointestinal: Soft and nontender. No distention. No abdominal bruits. No CVA tenderness.  Musculoskeletal: No lower extremity tenderness nor edema.  No joint effusions. Neurologic:  Normal speech and language. No gross focal neurologic deficits are appreciated. No gait instability. Skin:  Skin is warm, dry and intact. No rash noted. Psychiatric: Mood and affect are normal. Speech and behavior are normal.  ____________________________________________   LABS (all labs ordered are listed, but only abnormal results are displayed)  Labs Reviewed - No data to display ____________________________________________  RADIOLOGY   Chest x-ray negative ____________________________________________   PROCEDURES  Procedure(s) performed:  None  Critical Care performed: No  ____________________________________________   INITIAL IMPRESSION / ASSESSMENT AND PLAN / ED COURSE  Pertinent labs & imaging results that were available during my care of the patient were reviewed by me and considered in my medical decision making (see chart for details).  Acute tonsillitis with reactive airway disease. Rx given for albuterol inhaler and Zithromax. Patient to follow up PCP or return to the ER with any worsening symptomology. Patient voices no other emergency medical complaints at this time ____________________________________________   FINAL CLINICAL IMPRESSION(S) / ED DIAGNOSES  Final diagnoses:  Tonsillitis with exudate  Reactive airway disease, mild intermittent, uncomplicated      Evangeline DakinCharles M Beers, PA-C 11/19/14 1416  Emily FilbertJonathan E Williams, MD 11/19/14 [id number]

## 2014-11-19 NOTE — ED Notes (Signed)
Pt complains of sore throat and periods of shortness of breath that worsens while smoking. Pt is in no distress at this time.

## 2015-03-08 ENCOUNTER — Emergency Department
Admission: EM | Admit: 2015-03-08 | Discharge: 2015-03-08 | Disposition: A | Discharge status: Home / Self Care | Payer: No Typology Code available for payment source | Attending: Emergency Medicine | Admitting: Emergency Medicine

## 2015-03-08 ENCOUNTER — Encounter: Payer: Self-pay | Admitting: Emergency Medicine

## 2015-03-08 DIAGNOSIS — F17210 Nicotine dependence, cigarettes, uncomplicated: Secondary | ICD-10-CM | POA: Insufficient documentation

## 2015-03-08 DIAGNOSIS — J0110 Acute frontal sinusitis, unspecified: Secondary | ICD-10-CM | POA: Insufficient documentation

## 2015-03-08 DIAGNOSIS — Z792 Long term (current) use of antibiotics: Secondary | ICD-10-CM | POA: Insufficient documentation

## 2015-03-08 DIAGNOSIS — J069 Acute upper respiratory infection, unspecified: Secondary | ICD-10-CM | POA: Insufficient documentation

## 2015-03-08 MED ORDER — FLUTICASONE PROPIONATE 50 MCG/ACT NA SUSP: 1.0000 | Freq: Every day | NASAL | Status: DC

## 2015-03-08 MED ORDER — FLUTICASONE PROPIONATE 50 MCG/ACT NA SUSP
2.0000 | Freq: Every day | NASAL | Status: DC
Start: 2015-03-08 — End: 2015-12-13

## 2015-03-08 NOTE — ED Notes (Signed)
Throat red, slightly swollen. Dental caries molars bilateral. Lungs clear. A/o. Finished antibiotics 2 days ago.

## 2015-03-08 NOTE — ED Provider Notes (Signed)
[facility] Emergency Department Provider Note ____________________________________________  Time seen: 1240  I have reviewed the triage vital signs and the nursing notes.  HISTORY  Chief Complaint  Sore Throat  HPI Shawn Leblanc is a 30 y.o. male presents to the EDfor evaluation of symptoms including sore throat and sinus pressure and congestion for the last 4 days. The patient was recently treated for a dental abscess, and completed a 10 day course of amoxicillin, 3 days prior to arrival. He denies any interim fevers, chills, or sweats. He describes general malaise, and sinus pressure without drainage. He has not dosed any over-the-counter medications for his symptoms. He reports overall discomfort at a 2/10 in triage.  Past Medical History  Diagnosis Date  . Anxiety   . Umbilical hernia     There are no active problems to display for this patient.   History reviewed. No pertinent past surgical history.  Current Outpatient Rx  Name  Route  Sig  Dispense  Refill  . albuterol (PROVENTIL HFA;VENTOLIN HFA) 108 (90 BASE) MCG/ACT inhaler   Inhalation   Inhale 2 puffs into the lungs every 6 (six) hours as needed for wheezing or shortness of breath.   1 Inhaler   2   . amoxicillin (AMOXIL) 500 MG tablet   Oral   Take 1 tablet (500 mg total) by mouth 2 (two) times daily.   20 tablet   0   . clonazePAM (KLONOPIN) 0.5 MG tablet   Oral   Take 0.5 mg by mouth 2 (two) times daily as needed. For anxiety         . fluticasone (FLONASE) 50 MCG/ACT nasal spray   Each Nare   Place 2 sprays into both nostrils daily.   16 g   0     Allergies Review of patient's allergies indicates no known allergies.  No family history on file.  Social History Social History  Substance Use Topics  . Smoking status: Current Every Day Smoker -- 0.50 packs/day    Types: Cigarettes  . Smokeless tobacco: None  . Alcohol Use: No   Review of  Systems  Constitutional: Negative for fever. Eyes: Negative for visual changes. ENT: Negative for sore throat. Reports sinus pressure. Cardiovascular: Negative for chest pain. Respiratory: Negative for shortness of breath. Gastrointestinal: Negative for abdominal pain, vomiting and diarrhea. Genitourinary: Negative for dysuria. Musculoskeletal: Negative for back pain. Skin: Negative for rash. Neurological: Negative for headaches, focal weakness or numbness. ____________________________________________  PHYSICAL EXAM:  VITAL SIGNS: ED Triage Vitals  Enc Vitals Group     BP 03/08/15 1133 141/72 mmHg     Pulse Rate 03/08/15 1133 76     Resp 03/08/15 1133 18     Temp 03/08/15 1133 98 F (36.7 C)     Temp Source 03/08/15 1133 Oral     SpO2 03/08/15 1133 99 %     Weight 03/08/15 1133 180 lb (81.647 kg)     Height 03/08/15 1133  (1.905 m)     Head Cir --      Peak Flow --      Pain Score 03/08/15 1135 2     Pain Loc --      Pain Edu? --      Excl. in GC? --    Constitutional: Alert and oriented. Well appearing and in no distress. Head: Normocephalic and atraumatic. Positive tenderness to palpation over the maxillary and frontal sinuses.      Eyes: Conjunctivae are  normal. PERRL. Normal extraocular movements      Ears: Canals clear. TMs intact bilaterally.   Nose: No congestion/rhinorrhea. Nasal turbinates are erythematous and enlarged bilaterally.   Mouth/Throat: Mucous membranes are moist. Oropharynx is erythematous, tonsils are visible, but without edema or exudate.   Neck: Supple. No thyromegaly. Hematological/Lymphatic/Immunological: No cervical lymphadenopathy. Cardiovascular: Normal rate, regular rhythm.  Respiratory: Normal respiratory effort. No wheezes/rales/rhonchi. Gastrointestinal: Soft and nontender. No distention. Musculoskeletal: Nontender with normal range of motion in all extremities.  Neurologic:  Normal gait without ataxia. Normal speech and  language. No gross focal neurologic deficits are appreciated. Skin:  Skin is warm, dry and intact. No rash noted. Psychiatric: Mood and affect are normal. Patient exhibits appropriate insight and judgment. ____________________________________________  INITIAL IMPRESSION / ASSESSMENT AND PLAN / ED COURSE  Issue with an acute rhinitis and sinusitis on presentation. No indication of any infectious process on exam. Patient recently completed amoxicillin course which would have precluded him from any possible infection due to strep. He will dose over-the-counter allergy medicine + pseudoephedrine for symptom relief. He is provided with a prescription for Flonase to dose as directed for nasal congestion. A work note is provided for today, as requested. ____________________________________________  FINAL CLINICAL IMPRESSION(S) / ED DIAGNOSES  Final diagnoses:  URI (upper respiratory infection)  Acute frontal sinusitis, recurrence not specified      Lissa Hoard, PA-C 03/08/15 1607  Rockne Menghini, MD 03/09/15 1319

## 2015-03-08 NOTE — ED Notes (Signed)
Pt states has had sore throat for approximately one week. Denies fever. Pt states he has had some sinus pressure but denies drainage.

## 2015-03-08 NOTE — ED Notes (Signed)
Sore throat, head pressure, congestion x 4 days.  Recently taking amoxicillin for dental infection, finished RX 3 days ago.

## 2015-03-08 NOTE — Discharge Instructions (Signed)
Sinus Rinse WHAT IS A SINUS RINSE? A sinus rinse is a simple home treatment that is used to rinse your sinuses with a sterile mixture of salt and water (saline solution). Sinuses are air-filled spaces in your skull behind the bones of your face and forehead that open into your nasal cavity. You will use the following:  Saline solution.  Neti pot or spray bottle. This releases the saline solution into your nose and through your sinuses. Neti pots and spray bottles can be purchased at [employer], a health food store, or online. WHEN WOULD I DO A SINUS RINSE? A sinus rinse can help to clear mucus, dirt, dust, or pollen from the nasal cavity. You may do a sinus rinse when you have a cold, a virus, nasal allergy symptoms, a sinus infection, or stuffiness in the nose or sinuses. If you are considering a sinus rinse:  Ask your child's health care provider before performing a sinus rinse on your child.  Do not do a sinus rinse if you have had ear or nasal surgery, ear infection, or blocked ears. HOW DO I DO A SINUS RINSE?  Wash your hands.  Disinfect your device according to the directions provided and then dry it.  Use the solution that comes with your device or one that is sold separately in stores. Follow the mixing directions on the package.  Fill your device with the amount of saline solution as directed by the device instructions.  Stand over a sink and tilt your head sideways over the sink.  Place the spout of the device in your upper nostril (the one closer to the ceiling).  Gently pour or squeeze the saline solution into the nasal cavity. The liquid should drain to the lower nostril if you are not overly congested.  Gently blow your nose. Blowing too hard may cause ear pain.  Repeat in the other nostril.  Clean and rinse your device with clean water and then air-dry it. ARE THERE RISKS OF A SINUS RINSE?  Sinus rinse is generally very safe and effective. However, there  are a few risks, which include:   A burning sensation in the sinuses. This may happen if you do not make the saline solution as directed. Make sure to follow all directions when making the saline solution.  Infection from contaminated water. This is rare, but possible.  Nasal irritation.   This information is not intended to replace advice given to you by your health care provider. Make sure you discuss any questions you have with your health care provider.   Document Released: 08/08/2013 Document Reviewed: 08/08/2013 Elsevier Interactive Patient Education 2016 [employer].  [employer] Vaporizers Vaporizers may help relieve the symptoms of a cough and cold. They add moisture to the air, which helps mucus to become thinner and less sticky. This makes it easier to breathe and cough up secretions. Cool mist vaporizers do not cause serious burns like hot mist vaporizers, which may also be called steamers or humidifiers. Vaporizers have not been proven to help with colds. You should not use a vaporizer if you are allergic to mold. HOME CARE INSTRUCTIONS  Follow the package instructions for the vaporizer.  Do not use anything other than distilled water in the vaporizer.  Do not run the vaporizer all of the time. This can cause mold or bacteria to grow in the vaporizer.  Clean the vaporizer after each time it is used.  Clean and dry the vaporizer well before storing it.  Stop using the vaporizer if worsening respiratory symptoms develop.   This information is not intended to replace advice given to you by your health care provider. Make sure you discuss any questions you have with your health care provider.   Document Released: 10/09/2003 Document Revised: 01/16/2013 Document Reviewed: 05/31/2012 Elsevier Interactive Patient Education 2016 [employer].  Sinusitis, Adult Sinusitis is redness, soreness, and puffiness (inflammation) of the air pockets in the bones of your face (sinuses).  The redness, soreness, and puffiness can cause air and mucus to get trapped in your sinuses. This can allow germs to grow and cause an infection.  HOME CARE   Drink enough fluids to keep your pee (urine) clear or pale yellow.  Use a humidifier in your home.  Run a hot shower to create steam in the bathroom. Sit in the bathroom with the door closed. Breathe in the steam 3-4 times a day.  Put a warm, moist washcloth on your face 3-4 times a day, or as told by your doctor.  Use salt water sprays (saline sprays) to wet the thick fluid in your nose. This can help the sinuses drain.  Only take medicine as told by your doctor. GET HELP RIGHT AWAY IF:   Your pain gets worse.  You have very bad headaches.  You are sick to your stomach (nauseous).  You throw up (vomit).  You are very sleepy (drowsy) all the time.  Your face is puffy (swollen).  Your vision changes.  You have a stiff neck.  You have trouble breathing. MAKE SURE YOU:   Understand these instructions.  Will watch your condition.  Will get help right away if you are not doing well or get worse.   This information is not intended to replace advice given to you by your health care provider. Make sure you discuss any questions you have with your health care provider.   Document Released: 06/30/2007 Document Revised: 02/01/2014 Document Reviewed: 08/17/2011 Elsevier Interactive Patient Education 2016 [employer] Inc[employer]  Upper Respiratory Infection, Adult Most upper respiratory infections (URIs) are caused by a virus. A URI affects the nose, throat, and upper air passages. The most common type of URI is often called "the common cold." HOME CARE   Take medicines only as told by your doctor.  Gargle warm saltwater or take cough drops to comfort your throat as told by your doctor.  Use a warm mist humidifier or inhale steam from a shower to increase air moisture. This may make it easier to breathe.  Drink enough fluid to  keep your pee (urine) clear or pale yellow.  Eat soups and other clear broths.  Have a healthy diet.  Rest as needed.  Go back to work when your fever is gone or your doctor says it is okay.  You may need to stay home longer to avoid giving your URI to others.  You can also wear a face mask and wash your hands often to prevent spread of the virus.  Use your inhaler more if you have asthma.  Do not use any tobacco products, including cigarettes, chewing tobacco, or electronic cigarettes. If you need help quitting, ask your doctor. GET HELP IF:  You are getting worse, not better.  Your symptoms are not helped by medicine.  You have chills.  You are getting more short of breath.  You have brown or red mucus.  You have yellow or brown discharge from your nose.  You have pain in your face, especially when  you bend forward.  You have a fever.  You have puffy (swollen) neck glands.  You have pain while swallowing.  You have white areas in the back of your throat. GET HELP RIGHT AWAY IF:   You have very bad or constant:  Headache.  Ear pain.  Pain in your forehead, behind your eyes, and over your cheekbones (sinus pain).  Chest pain.  You have long-lasting (chronic) lung disease and any of the following:  Wheezing.  Long-lasting cough.  Coughing up blood.  A change in your usual mucus.  You have a stiff neck.  You have changes in your:  Vision.  Hearing.  Thinking.  Mood. MAKE SURE YOU:   Understand these instructions.  Will watch your condition.  Will get help right away if you are not doing well or get worse.   This information is not intended to replace advice given to you by your health care provider. Make sure you discuss any questions you have with your health care provider.   Document Released: 06/30/2007 Document Revised: 05/28/2014 Document Reviewed: 04/18/2013 Elsevier Interactive Patient Education [employer]   Your  exam is essentially normal today. You have a likely viral infection. You also have some irritation and swelling of the nasal sinuses. Start a daily allergy medicine (Allegra, Claritin, Zyrtec) plus a decongestant (psuedoephedrine) for symptom relief. Follow-up with [facility] for ongoing symptoms.

## 2015-03-11 MED ORDER — ONDANSETRON HCL 4 MG/2ML IJ SOLN
INTRAMUSCULAR | Status: AC
  Filled 2015-03-11: qty 2

## 2015-03-11 MED ORDER — MORPHINE SULFATE (PF) 4 MG/ML IV SOLN
INTRAVENOUS | Status: AC
  Filled 2015-03-11: qty 1

## 2015-05-21 ENCOUNTER — Emergency Department: Payer: No Typology Code available for payment source

## 2015-05-21 ENCOUNTER — Emergency Department
Admission: EM | Admit: 2015-05-21 | Discharge: 2015-05-21 | Disposition: A | Discharge status: Home / Self Care | Payer: No Typology Code available for payment source | Attending: Emergency Medicine | Admitting: Emergency Medicine

## 2015-05-21 ENCOUNTER — Encounter: Payer: Self-pay | Admitting: *Deleted

## 2015-05-21 DIAGNOSIS — R0602 Shortness of breath: Secondary | ICD-10-CM | POA: Insufficient documentation

## 2015-05-21 DIAGNOSIS — F17210 Nicotine dependence, cigarettes, uncomplicated: Secondary | ICD-10-CM | POA: Insufficient documentation

## 2015-05-21 DIAGNOSIS — R079 Chest pain, unspecified: Secondary | ICD-10-CM

## 2015-05-21 LAB — CBC
HCT: 44.1 % (ref 40.0–52.0)
HEMOGLOBIN: 15.5 g/dL (ref 13.0–18.0)
MCH: 30.9 pg (ref 26.0–34.0)
MCHC: 35.1 g/dL (ref 32.0–36.0)
MCV: 88.1 fL (ref 80.0–100.0)
Platelets: 216 10*3/uL (ref 150–440)
RBC: 5.01 MIL/uL (ref 4.40–5.90)
RDW: 13.1 % (ref 11.5–14.5)
WBC: 10.4 10*3/uL (ref 3.8–10.6)

## 2015-05-21 LAB — BASIC METABOLIC PANEL
ANION GAP: 9 (ref 5–15)
BUN: 18 mg/dL (ref 6–20)
CALCIUM: 9.7 mg/dL (ref 8.9–10.3)
CO2: 27 mmol/L (ref 22–32)
Chloride: 104 mmol/L (ref 101–111)
Creatinine, Ser: 1.18 mg/dL (ref 0.61–1.24)
GFR calc Af Amer: >60 mL/min (ref >60)
Glucose, Bld: 101 mg/dL — ABNORMAL HIGH (ref 65–99)
Potassium: 4.3 mmol/L (ref 3.5–5.1)
Sodium: 140 mmol/L (ref 135–145)

## 2015-05-21 LAB — TROPONIN I

## 2015-05-21 LAB — FIBRIN DERIVATIVES D-DIMER (ARMC ONLY): FIBRIN DERIVATIVES D-DIMER (ARMC): 77 (ref 0–499)

## 2015-05-21 MED ORDER — LORAZEPAM 1 MG PO TABS: 1.0000 mg | ORAL_TABLET | Freq: Two times a day (BID) | ORAL | Status: DC

## 2015-05-21 NOTE — Discharge Instructions (Signed)
Nonspecific Chest Pain  °Chest pain can be caused by many different conditions. There is always a chance that your pain could be related to something serious, such as a heart attack or a blood clot in your lungs. Chest pain can also be caused by conditions that are not life-threatening. If you have chest pain, it is very important to follow up with your health care provider. °CAUSES  °Chest pain can be caused by: °· Heartburn. °· Pneumonia or bronchitis. °· Anxiety or stress. °· Inflammation around your heart (pericarditis) or lung (pleuritis or pleurisy). °· A blood clot in your lung. °· A collapsed lung (pneumothorax). It can develop suddenly on its own (spontaneous pneumothorax) or from trauma to the chest. °· Shingles infection (varicella-zoster virus). °· Heart attack. °· Damage to the bones, muscles, and cartilage that make up your chest wall. This can include: °· Bruised bones due to injury. °· Strained muscles or cartilage due to frequent or repeated coughing or overwork. °· Fracture to one or more ribs. °· Sore cartilage due to inflammation (costochondritis). °RISK FACTORS  °Risk factors for chest pain may include: °· Activities that increase your risk for trauma or injury to your chest. °· Respiratory infections or conditions that cause frequent coughing. °· Medical conditions or overeating that can cause heartburn. °· Heart disease or family history of heart disease. °· Conditions or health behaviors that increase your risk of developing a blood clot. °· Having had chicken pox (varicella zoster). °SIGNS AND SYMPTOMS °Chest pain can feel like: °· Burning or tingling on the surface of your chest or deep in your chest. °· Crushing, pressure, aching, or squeezing pain. °· Dull or sharp pain that is worse when you move, cough, or take a deep breath. °· Pain that is also felt in your back, neck, shoulder, or arm, or pain that spreads to any of these areas. °Your chest pain may come and go, or it may stay  constant. °DIAGNOSIS °Lab tests or other studies may be needed to find the cause of your pain. Your health care provider may have you take a test called an ambulatory ECG (electrocardiogram). An ECG records your heartbeat patterns at the time the test is performed. You may also have other tests, such as: °· Transthoracic echocardiogram (TTE). During echocardiography, sound waves are used to create a picture of all of the heart structures and to look at how blood flows through your heart. °· Transesophageal echocardiogram (TEE). This is a more advanced imaging test that obtains images from inside your body. It allows your health care provider to see your heart in finer detail. °· Cardiac monitoring. This allows your health care provider to monitor your heart rate and rhythm in real time. °· Holter monitor. This is a portable device that records your heartbeat and can help to diagnose abnormal heartbeats. It allows your health care provider to track your heart activity for several days, if needed. °· Stress tests. These can be done through exercise or by taking medicine that makes your heart beat more quickly. °· Blood tests. °· Imaging tests. °TREATMENT  °Your treatment depends on what is causing your chest pain. Treatment may include: °· Medicines. These may include: °· Acid blockers for heartburn. °· Anti-inflammatory medicine. °· Pain medicine for inflammatory conditions. °· Antibiotic medicine, if an infection is present. °· Medicines to dissolve blood clots. °· Medicines to treat coronary artery disease. °· Supportive care for conditions that do not require medicines. This may include: °· Resting. °· Applying heat   or cold packs to injured areas. °· Limiting activities until pain decreases. °HOME CARE INSTRUCTIONS °· If you were prescribed an antibiotic medicine, finish it all even if you start to feel better. °· Avoid any activities that bring on chest pain. °· Do not use any tobacco products, including  cigarettes, chewing tobacco, or electronic cigarettes. If you need help quitting, ask your health care provider. °· Do not drink alcohol. °· Take medicines only as directed by your health care provider. °· Keep all follow-up visits as directed by your health care provider. This is important. This includes any further testing if your chest pain does not go away. °· If heartburn is the cause for your chest pain, you may be told to keep your head raised (elevated) while sleeping. This reduces the chance that acid will go from your stomach into your esophagus. °· Make lifestyle changes as directed by your health care provider. These may include: °· Getting regular exercise. Ask your health care provider to suggest some activities that are safe for you. °· Eating a heart-healthy diet. A registered dietitian can help you to learn healthy eating options. °· Maintaining a healthy weight. °· Managing diabetes, if necessary. °· Reducing stress. °SEEK MEDICAL CARE IF: °· Your chest pain does not go away after treatment. °· You have a rash with blisters on your chest. °· You have a fever. °SEEK IMMEDIATE MEDICAL CARE IF:  °· Your chest pain is worse. °· You have an increasing cough, or you cough up blood. °· You have severe abdominal pain. °· You have severe weakness. °· You faint. °· You have chills. °· You have sudden, unexplained chest discomfort. °· You have sudden, unexplained discomfort in your arms, back, neck, or jaw. °· You have shortness of breath at any time. °· You suddenly start to sweat, or your skin gets clammy. °· You feel nauseous or you vomit. °· You suddenly feel light-headed or dizzy. °· Your heart begins to beat quickly, or it feels like it is skipping beats. °These symptoms may represent a serious problem that is an emergency. Do not wait to see if the symptoms will go away. Get medical help right away. Call your local emergency services (911 in the [place] Do not drive yourself to the hospital. °  °This  information is not intended to replace advice given to you by your health care provider. Make sure you discuss any questions you have with your health care provider. °  °Document Released: 10/21/2004 Document Revised: 02/01/2014 Document Reviewed: 08/17/2013 °Elsevier Interactive Patient Education ©2016 Elsevier Inc. ° °Panic Attacks °Panic attacks are sudden, short-lived surges of severe anxiety, fear, or discomfort. They may occur for no reason when you are relaxed, when you are anxious, or when you are sleeping. Panic attacks may occur for a number of reasons:  °· Healthy people occasionally have panic attacks in extreme, life-threatening situations, such as war or natural disasters. Normal anxiety is a protective mechanism of the body that helps us react to danger (fight or flight response). °· Panic attacks are often seen with anxiety disorders, such as panic disorder, social anxiety disorder, generalized anxiety disorder, and phobias. Anxiety disorders cause excessive or uncontrollable anxiety. They may interfere with your relationships or other life activities. °· Panic attacks are sometimes seen with other mental illnesses, such as depression and posttraumatic stress disorder. °· Certain medical conditions, prescription medicines, and drugs of abuse can cause panic attacks. °SYMPTOMS  °Panic attacks start suddenly, peak within 20 minutes, and are   accompanied by four or more of the following symptoms: °· Pounding heart or fast heart rate (palpitations). °· Sweating. °· Trembling or shaking. °· Shortness of breath or feeling smothered. °· Feeling choked. °· Chest pain or discomfort. °· Nausea or strange feeling in your stomach. °· Dizziness, light-headedness, or feeling like you will faint. °· Chills or hot flushes. °· Numbness or tingling in your lips or hands and feet. °· Feeling that things are not real or feeling that you are not yourself. °· Fear of losing control or going crazy. °· Fear of dying. °Some  of these symptoms can mimic serious medical conditions. For example, you may think you are having a heart attack. Although panic attacks can be very scary, they are not life threatening. °DIAGNOSIS  °Panic attacks are diagnosed through an assessment by your health care provider. Your health care provider will ask questions about your symptoms, such as where and when they occurred. Your health care provider will also ask about your medical history and use of alcohol and drugs, including prescription medicines. Your health care provider may order blood tests or other studies to rule out a serious medical condition. Your health care provider may refer you to a mental health professional for further evaluation. °TREATMENT  °· Most healthy people who have one or two panic attacks in an extreme, life-threatening situation will not require treatment. °· The treatment for panic attacks associated with anxiety disorders or other mental illness typically involves counseling with a mental health professional, medicine, or a combination of both. Your health care provider will help determine what treatment is best for you. °· Panic attacks due to physical illness usually go away with treatment of the illness. If prescription medicine is causing panic attacks, talk with your health care provider about stopping the medicine, decreasing the dose, or substituting another medicine. °· Panic attacks due to alcohol or drug abuse go away with abstinence. Some adults need professional help in order to stop drinking or using drugs. °HOME CARE INSTRUCTIONS  °· Take all medicines as directed by your health care provider.   °· Schedule and attend follow-up visits as directed by your health care provider. It is important to keep all your appointments. °SEEK MEDICAL CARE IF: °· You are not able to take your medicines as prescribed. °· Your symptoms do not improve or get worse. °SEEK IMMEDIATE MEDICAL CARE IF:  °· You experience panic attack  symptoms that are different than your usual symptoms. °· You have serious thoughts about hurting yourself or others. °· You are taking medicine for panic attacks and have a serious side effect. °MAKE SURE YOU: °· Understand these instructions. °· Will watch your condition. °· Will get help right away if you are not doing well or get worse. °  °This information is not intended to replace advice given to you by your health care provider. Make sure you discuss any questions you have with your health care provider. °  °Document Released: 01/11/2005 Document Revised: 01/16/2013 Document Reviewed: 08/25/2012 °Elsevier Interactive Patient Education ©2016 Elsevier Inc. ° °

## 2015-05-21 NOTE — ED Notes (Signed)
NAD noted at time of D/C. Pt denies questions or concerns. Pt ambulatory to the lobby at this time.  

## 2015-05-21 NOTE — ED Notes (Signed)
Patient transported to X-ray 

## 2015-05-21 NOTE — ED Notes (Signed)
Pt complains of right sided chest pain with shortness of breath for the last 3 days, pt denies any other symptoms

## 2015-05-21 NOTE — ED Provider Notes (Signed)
[facility] Emergency Department Provider Note        Time seen: ----------------------------------------- 4:59 PM on 05/21/2015 -----------------------------------------    I have reviewed the triage vital signs and the nursing notes.   HISTORY  Chief Complaint Chest Pain    HPI Shawn Leblanc is a 30 y.o. male who presents to ER for right-sided chest pain with shortness of breath for the past 3 days. Patient denies a history of this before, nothing makes it better or worse. Patient states the pain is dull. He denies fevers, but had recent body aches.   Past Medical History  Diagnosis Date  . Anxiety   . Umbilical hernia     There are no active problems to display for this patient.   History reviewed. No pertinent past surgical history.  Allergies Review of patient's allergies indicates no known allergies.  Social History Social History  Substance Use Topics  . Smoking status: Current Every Day Smoker -- 0.50 packs/day    Types: Cigarettes  . Smokeless tobacco: None  . Alcohol Use: No    Review of Systems Constitutional: Negative for fever. Eyes: Negative for visual changes. ENT: Negative for sore throat. Cardiovascular: Positive for chest pain Respiratory: Positive for shortness of breath Gastrointestinal: Negative for abdominal pain, vomiting and diarrhea. Genitourinary: Negative for dysuria. Musculoskeletal: Negative for back pain. Skin: Negative for rash. Neurological: Negative for headaches, focal weakness or numbness.  10-point ROS otherwise negative.  ____________________________________________   PHYSICAL EXAM:  VITAL SIGNS: ED Triage Vitals  Enc Vitals Group     BP 05/21/15 1617 130/71 mmHg     Pulse Rate 05/21/15 1617 83     Resp 05/21/15 1617 18     Temp 05/21/15 1617 98.3 F (36.8 C)     Temp Source 05/21/15 1617 Oral     SpO2 05/21/15 1617 98 %     Weight 05/21/15 1617 185 lb (83.915 kg)     Height  05/21/15 1617  (1.905 m)     Head Cir --      Peak Flow --      Pain Score --      Pain Loc --      Pain Edu? --      Excl. in GC? --    Constitutional: Alert and oriented. Well appearing and in no distress. Eyes: Conjunctivae are normal. PERRL. Normal extraocular movements. ENT   Head: Normocephalic and atraumatic.   Nose: No congestion/rhinnorhea.   Mouth/Throat: Mucous membranes are moist.   Neck: No stridor. Cardiovascular: Normal rate, regular rhythm. No murmurs, rubs, or gallops. Respiratory: Normal respiratory effort without tachypnea nor retractions. Breath sounds are clear and equal bilaterally. No wheezes/rales/rhonchi. Gastrointestinal: Soft and nontender. Normal bowel sounds Musculoskeletal: Nontender with normal range of motion in all extremities. No lower extremity tenderness nor edema. Neurologic:  Normal speech and language. No gross focal neurologic deficits are appreciated.  Skin:  Skin is warm, dry and intact. No rash noted. Psychiatric: Mood and affect are normal. Speech and behavior are normal.  ____________________________________________  EKG: Interpreted by me. Normal sinus rhythm at a rate of 76 bpm, normal PR interval, normal QRS, normal QT interval. Complete right bundle-branch block.  ____________________________________________  ED COURSE:  Pertinent labs & imaging results that were available during my care of the patient were reviewed by me and considered in my medical decision making (see chart for details). Patient is in no acute distress, will check basic cardiac labs and reevaluate. ____________________________________________  LABS (pertinent positives/negatives)  Labs Reviewed  BASIC METABOLIC PANEL - Abnormal; Notable for the following:    Glucose, Bld 101 (*)    All other components within normal limits  CBC  TROPONIN I  FIBRIN DERIVATIVES D-DIMER ([facility] ONLY)    RADIOLOGY   IMPRESSION: No active cardiopulmonary  disease.  ____________________________________________  FINAL ASSESSMENT AND PLAN  Chest pain, anxiety  Plan: Patient with labs and imaging as dictated above. Patient presents with right-sided chest pain and anxiety symptoms. No acute etiology for his symptoms are found. He is stable for outpatient follow-up with his doctor.   Emily FilbertWilliams, Jonathan E, MD   Note: This dictation was prepared with Dragon dictation. Any transcriptional errors that result from this process are unintentional   Emily FilbertJonathan E Williams, MD 05/21/15 1758

## 2015-05-25 ENCOUNTER — Emergency Department
Admission: EM | Admit: 2015-05-25 | Discharge: 2015-05-25 | Disposition: A | Discharge status: Home / Self Care | Payer: No Typology Code available for payment source | Attending: Emergency Medicine | Admitting: Emergency Medicine

## 2015-05-25 ENCOUNTER — Encounter: Payer: Self-pay | Admitting: Emergency Medicine

## 2015-05-25 DIAGNOSIS — R0789 Other chest pain: Secondary | ICD-10-CM | POA: Insufficient documentation

## 2015-05-25 DIAGNOSIS — K429 Umbilical hernia without obstruction or gangrene: Secondary | ICD-10-CM | POA: Insufficient documentation

## 2015-05-25 DIAGNOSIS — R079 Chest pain, unspecified: Secondary | ICD-10-CM

## 2015-05-25 DIAGNOSIS — F419 Anxiety disorder, unspecified: Secondary | ICD-10-CM | POA: Insufficient documentation

## 2015-05-25 DIAGNOSIS — F17210 Nicotine dependence, cigarettes, uncomplicated: Secondary | ICD-10-CM | POA: Insufficient documentation

## 2015-05-25 LAB — CBC
HCT: 44.2 % (ref 40.0–52.0)
Hemoglobin: 15.1 g/dL (ref 13.0–18.0)
MCH: 30.9 pg (ref 26.0–34.0)
MCHC: 34.2 g/dL (ref 32.0–36.0)
MCV: 90.4 fL (ref 80.0–100.0)
PLATELETS: 194 10*3/uL (ref 150–440)
RBC: 4.89 MIL/uL (ref 4.40–5.90)
RDW: 13.1 % (ref 11.5–14.5)
WBC: 10.5 10*3/uL (ref 3.8–10.6)

## 2015-05-25 LAB — COMPREHENSIVE METABOLIC PANEL
ALK PHOS: 40 U/L (ref 38–126)
ALT: 12 U/L — AB (ref 17–63)
AST: 17 U/L (ref 15–41)
Albumin: 4.4 g/dL (ref 3.5–5.0)
Anion gap: 7 (ref 5–15)
BILIRUBIN TOTAL: 1.6 mg/dL — AB (ref 0.3–1.2)
BUN: 16 mg/dL (ref 6–20)
CALCIUM: 9.3 mg/dL (ref 8.9–10.3)
CO2: 27 mmol/L (ref 22–32)
CREATININE: 1.1 mg/dL (ref 0.61–1.24)
Chloride: 105 mmol/L (ref 101–111)
GFR calc non Af Amer: >60 mL/min (ref >60)
Glucose, Bld: 86 mg/dL (ref 65–99)
Potassium: 4 mmol/L (ref 3.5–5.1)
Sodium: 139 mmol/L (ref 135–145)
Total Protein: 7.2 g/dL (ref 6.5–8.1)

## 2015-05-25 LAB — TROPONIN I

## 2015-05-25 NOTE — Discharge Instructions (Signed)
Hernia A hernia happens when an organ or tissue inside your body pushes out through a weak spot in the belly (abdomen). HOME CARE  Avoid stretching or overusing (straining) the muscles near the hernia.  Do not lift anything heavier than 10 lb (4.5 kg).  Use the muscles in your leg when you lift something up. Do not use the muscles in your back.  When you cough, try to cough gently.  Eat a diet that has a lot of fiber. Eat lots of fruits and vegetables.  Drink enough fluids to keep your pee (urine) clear or pale yellow. Try to drink 6-8 glasses of water a day.  Take medicines to make your poop soft (stool softeners) as told by your doctor.  Lose weight, if you are overweight.  Do not use any tobacco products, including cigarettes, chewing tobacco, or electronic cigarettes. If you need help quitting, ask your doctor.  Keep all follow-up visits as told by your doctor. This is important. GET HELP IF:  The skin by the hernia gets puffy (swollen) or red.  The hernia is painful. GET HELP RIGHT AWAY IF:  You have a fever.  You have belly pain that is getting worse.  You feel sick to your stomach (nauseous) or you throw up (vomit).  You cannot push the hernia back in place by gently pressing on it while you are lying down.  The hernia:  Changes in shape or size.  Is stuck outside your belly.  Changes color.  Feels hard or tender.   This information is not intended to replace advice given to you by your health care provider. Make sure you discuss any questions you have with your health care provider.   Document Released: 07/01/2009 Document Revised: 02/01/2014 Document Reviewed: 11/21/2013 Elsevier Interactive Patient Education 2016 Else[employer] Inc[employer]  Nonspecific Chest Pain It is often hard to find the cause of chest pain. There is always a chance that your pain could be related to something serious, such as a heart attack or a blood clot in your lungs. Chest pain can also be  caused by conditions that are not life-threatening. If you have chest pain, it is very important to follow up with your doctor.  HOME CARE  If you were prescribed an antibiotic medicine, finish it all even if you start to feel better.  Avoid any activities that cause chest pain.  Do not use any tobacco products, including cigarettes, chewing tobacco, or electronic cigarettes. If you need help quitting, ask your doctor.  Do not drink alcohol.  Take medicines only as told by your doctor.  Keep all follow-up visits as told by your doctor. This is important. This includes any further testing if your chest pain does not go away.  Your doctor may tell you to keep your head raised (elevated) while you sleep.  Make lifestyle changes as told by your doctor. These may include:  Getting regular exercise. Ask your doctor to suggest some activities that are safe for you.  Eating a heart-healthy diet. Your doctor or a diet specialist (dietitian) can help you to learn healthy eating options.  Maintaining a healthy weight.  Managing diabetes, if necessary.  Reducing stress. GET HELP IF:  Your chest pain does not go away, even after treatment.  You have a rash with blisters on your chest.  You have a fever. GET HELP RIGHT AWAY IF:  Your chest pain is worse.  You have an increasing cough, or you cough up blood.  You  have severe belly (abdominal) pain.  You feel extremely weak.  You pass out (faint).  You have chills.  You have sudden, unexplained chest discomfort.  You have sudden, unexplained discomfort in your arms, back, neck, or jaw.  You have shortness of breath at any time.  You suddenly start to sweat, or your skin gets clammy.  You feel nauseous.  You vomit.  You suddenly feel light-headed or dizzy.  Your heart begins to beat quickly, or it feels like it is skipping beats. These symptoms may be an emergency. Do not wait to see if the symptoms will go away. Get  medical help right away. Call your local emergency services (911 in the [place] Do not drive yourself to the hospital.   This information is not intended to replace advice given to you by your health care provider. Make sure you discuss any questions you have with your health care provider.   Document Released: 06/30/2007 Document Revised: 02/01/2014 Document Reviewed: 08/17/2013 Elsevier Interactive Patient Education [employer] Elsevier Inc.

## 2015-05-25 NOTE — ED Notes (Signed)
E-signature box not working. Pt verbalized understanding of discharge instructions and denied questions. 

## 2015-05-25 NOTE — ED Provider Notes (Signed)
[facility] Emergency Department Provider Note  ____________________________________________    I have reviewed the triage vital signs and the nursing notes.   HISTORY  Chief Complaint Chest Pain and Headache    HPI Shawn Leblanc is a 30 y.o. male who presents with several complaints. He notes that earlier while he was at work he felt dizzy but notes that has resolved. He denies syncope. He also complains of continued discomfort in the right lower chest. He was seen in the emergency department several days ago for similar symptoms and had a normal workup. Finally he reports he had a headache yesterday which is also resolved and has not had one today. He also reports that he has had an umbilical hernia since 2013 which has been intermittently causing him discomfort but not currently.     Past Medical History  Diagnosis Date  . Anxiety   . Umbilical hernia     There are no active problems to display for this patient.   History reviewed. No pertinent past surgical history.  Current Outpatient Rx  Name  Route  Sig  Dispense  Refill  . albuterol (PROVENTIL HFA;VENTOLIN HFA) 108 (90 BASE) MCG/ACT inhaler   Inhalation   Inhale 2 puffs into the lungs every 6 (six) hours as needed for wheezing or shortness of breath.   1 Inhaler   2   . amoxicillin (AMOXIL) 500 MG tablet   Oral   Take 1 tablet (500 mg total) by mouth 2 (two) times daily.   20 tablet   0   . clonazePAM (KLONOPIN) 0.5 MG tablet   Oral   Take 0.5 mg by mouth 2 (two) times daily as needed. For anxiety         . fluticasone (FLONASE) 50 MCG/ACT nasal spray   Each Nare   Place 2 sprays into both nostrils daily.   16 g   0   . LORazepam (ATIVAN) 1 MG tablet   Oral   Take 1 tablet (1 mg total) by mouth 2 (two) times daily.   20 tablet   0     Allergies Review of patient's allergies indicates no known allergies.  History reviewed. No pertinent family history.  Social  History Social History  Substance Use Topics  . Smoking status: Current Every Day Smoker -- 0.50 packs/day    Types: Cigarettes  . Smokeless tobacco: None  . Alcohol Use: No    Review of Systems  Constitutional: Negative for fever. Eyes: Negative for redness ENT: Negative for sore throat Cardiovascular: As above Respiratory: Negative for shortness of breath. Gastrointestinal: Umbilical hernia as above, no nausea vomiting or diarrhea Genitourinary: Negative for dysuria. Musculoskeletal: Negative for back pain. Skin: Negative for rash. Neurological: Negative for focal weakness Psychiatric: Positive for anxiety    ____________________________________________   PHYSICAL EXAM:  VITAL SIGNS: ED Triage Vitals  Enc Vitals Group     BP 05/25/15 1411 134/58 mmHg     Pulse Rate 05/25/15 1411 73     Resp 05/25/15 1411 16     Temp 05/25/15 1411 99.1 F (37.3 C)     Temp Source 05/25/15 1411 Oral     SpO2 05/25/15 1411 98 %     Weight 05/25/15 1411 185 lb (83.915 kg)     Height 05/25/15 1411  (1.905 m)     Head Cir --      Peak Flow --      Pain Score 05/25/15 1412 0  Pain Loc --      Pain Edu? --      Excl. in GC? --     Constitutional: Alert and oriented. Well appearing and in no distress.  Eyes: Conjunctivae are normal. No erythema or injection ENT   Head: Normocephalic and atraumatic.   Mouth/Throat: Mucous membranes are moist. Cardiovascular: Normal rate, regular rhythm. Normal and symmetric distal pulses are present in the upper extremities. No murmurs or rubs . No chest wall tenderness to palpation Respiratory: Normal respiratory effort without tachypnea nor retractions. Breath sounds are clear and equal bilaterally.  Gastrointestinal: Soft and non-tender in all quadrants. No distention. There is no CVA tenderness. Umbilical hernia is soft and nontender no erythema Genitourinary: deferred Musculoskeletal: Nontender with normal range of motion in all  extremities. No lower extremity tenderness nor edema. Neurologic:  Normal speech and language. No gross focal neurologic deficits are appreciated. Skin:  Skin is warm, dry and intact. No rash noted. Psychiatric: Mood and affect are normal. Patient exhibits appropriate insight and judgment.  ____________________________________________    LABS (pertinent positives/negatives)  Labs Reviewed  COMPREHENSIVE METABOLIC PANEL  CBC  TROPONIN I    ____________________________________________   EKG  ED ECG REPORT I, Jene EveryKINNER, Laiklynn Raczynski, the attending physician, personally viewed and interpreted this ECG.  Date: 05/25/2015 EKG Time: 2:21 PM Rate: 74 Rhythm: normal sinus rhythm QRS Axis: normal Intervals: normal ST/T Wave abnormalities: normal Conduction Disturbances: Incomplete right bundle branch block Narrative Interpretation: Unchanged from prior EKG   ____________________________________________    RADIOLOGY  None  ____________________________________________   PROCEDURES  Procedure(s) performed: none  Critical Care performed: none  ____________________________________________   INITIAL IMPRESSION / ASSESSMENT AND PLAN / ED COURSE  Pertinent labs & imaging results that were available during my care of the patient were reviewed by me and considered in my medical decision making (see chart for details).  Patient presents with multiple complaints. We will recheck labs, including troponin CMP CBC. EKG is unchanged. Negative d-dimer 4 days ago. No pleurisy. Patient is not followed up as an outpatient. Abdominal hernia is nontender nor swollen. Headache occurred yesterday and not today. Dizziness has resolved, vitals are unremarkable.  Lab work is unremarkable. Patient is well-appearing. Strongly encouraged outpatient follow-up. We did discuss return precautions.  ____________________________________________   FINAL CLINICAL IMPRESSION(S) / ED DIAGNOSES  Final  diagnoses:  Chest pain, unspecified chest pain type  Umbilical hernia without obstruction and without gangrene          Jene Everyobert Jayln Madeira, MD 05/25/15 1843

## 2015-05-25 NOTE — ED Notes (Signed)
Pt presents to ED with recurrent chest pain on right lower rib area that hurts when he breathes in . Pt states he feels unbalanced especially when at work. Alert and oriented x4

## 2015-11-12 ENCOUNTER — Emergency Department
Admission: EM | Admit: 2015-11-12 | Discharge: 2015-11-12 | Disposition: A | Discharge status: Home / Self Care | Payer: Self-pay | Attending: Student in an Organized Health Care Education/Training Program | Admitting: Student in an Organized Health Care Education/Training Program

## 2015-11-12 ENCOUNTER — Emergency Department: Payer: Self-pay

## 2015-11-12 ENCOUNTER — Encounter: Payer: Self-pay | Admitting: Medical Oncology

## 2015-11-12 DIAGNOSIS — Z79899 Other long term (current) drug therapy: Secondary | ICD-10-CM | POA: Insufficient documentation

## 2015-11-12 DIAGNOSIS — B9789 Other viral agents as the cause of diseases classified elsewhere: Secondary | ICD-10-CM

## 2015-11-12 DIAGNOSIS — J069 Acute upper respiratory infection, unspecified: Secondary | ICD-10-CM | POA: Insufficient documentation

## 2015-11-12 DIAGNOSIS — J029 Acute pharyngitis, unspecified: Secondary | ICD-10-CM

## 2015-11-12 DIAGNOSIS — F17210 Nicotine dependence, cigarettes, uncomplicated: Secondary | ICD-10-CM | POA: Insufficient documentation

## 2015-11-12 DIAGNOSIS — M940 Chondrocostal junction syndrome [Tietze]: Secondary | ICD-10-CM | POA: Insufficient documentation

## 2015-11-12 MED ORDER — PROMETHAZINE-DM 6.25-15 MG/5ML PO SYRP: 5.0000 mL | ORAL_SOLUTION | Freq: Four times a day (QID) | ORAL | 0 refills | Status: DC | PRN

## 2015-11-12 MED ORDER — LIDOCAINE VISCOUS 2 % MT SOLN: 5.0000 mL | Freq: Four times a day (QID) | OROMUCOSAL | 0 refills | Status: DC | PRN

## 2015-11-12 MED ORDER — NAPROXEN 500 MG PO TABS: 500.0000 mg | ORAL_TABLET | Freq: Two times a day (BID) | ORAL | Status: DC

## 2015-11-12 NOTE — ED Notes (Signed)
Pt states understanding of discharge instructions. NAD noted at this time.  

## 2015-11-12 NOTE — ED Notes (Signed)
Sore throat, pain with swallowing X 1 week. Pt alert and oriented X4, active, cooperative, pt in NAD. RR even and unlabored, color WNL.

## 2015-11-12 NOTE — ED Triage Notes (Signed)
Pt reports that his throat has felt "raw" for the past week with cold sx's and cough.

## 2015-11-12 NOTE — ED Provider Notes (Signed)
[facility] Emergency Department Provider Note   ____________________________________________   First MD Initiated Contact with Patient 11/12/15 1436     (approximate)  I have reviewed the triage vital signs and the nursing notes.   HISTORY  Chief Complaint Sore Throat and Cough    HPI Shawn Leblanc is a 30 y.o. male patient complain of chest wall pain, sore throat, and productive cough 1 week. Patient's redness pain is a 5/10. No palliative measures taken for this complaint. Patient describes his pain as "achy".   Past Medical History:  Diagnosis Date  . Anxiety   . Umbilical hernia     There are no active problems to display for this patient.   History reviewed. No pertinent surgical history.  Prior to Admission medications   Medication Sig Start Date End Date Taking? Authorizing Provider  albuterol (PROVENTIL HFA;VENTOLIN HFA) 108 (90 BASE) MCG/ACT inhaler Inhale 2 puffs into the lungs every 6 (six) hours as needed for wheezing or shortness of breath. 11/19/14   Charmayne Sheerharles M Beers, PA-C  amoxicillin (AMOXIL) 500 MG tablet Take 1 tablet (500 mg total) by mouth 2 (two) times daily. 11/19/14   Charmayne Sheerharles M Beers, PA-C  clonazePAM (KLONOPIN) 0.5 MG tablet Take 0.5 mg by mouth 2 (two) times daily as needed. For anxiety    Historical Provider, MD  fluticasone (FLONASE) 50 MCG/ACT nasal spray Place 2 sprays into both nostrils daily. 03/08/15   Jenise V Bacon Menshew, PA-C  lidocaine (XYLOCAINE) 2 % solution Use as directed 5 mLs in the mouth or throat every 6 (six) hours as needed for mouth pain. 5 ML's 11/12/15   Joni Reiningonald K Gloristine Turrubiates, PA-C  LORazepam (ATIVAN) 1 MG tablet Take 1 tablet (1 mg total) by mouth 2 (two) times daily. 05/21/15 05/20/16  Emily FilbertJonathan E Williams, MD  naproxen (NAPROSYN) 500 MG tablet Take 1 tablet (500 mg total) by mouth 2 (two) times daily with a meal. 11/12/15   Joni Reiningonald K Luvern Mischke, PA-C  promethazine-dextromethorphan (PROMETHAZINE-DM) 6.25-15  MG/5ML syrup Take 5 mLs by mouth 4 (four) times daily as needed for cough. Mixed with 5 mL of viscous lidocaine swish and swallow. 11/12/15   Joni Reiningonald K Jedadiah Abdallah, PA-C    Allergies Review of patient's allergies indicates no known allergies.  No family history on file.  Social History Social History  Substance Use Topics  . Smoking status: Current Every Day Smoker    Packs/day: 0.50    Types: Cigarettes  . Smokeless tobacco: Not on file  . Alcohol use No    Review of Systems Constitutional: No fever/chills Eyes: No visual changes. ENT: No throat Cardiovascular: Respiratory: Denies shortness of breath. 4. Nonproductive cough Gastrointestinal: No abdominal pain.  No nausea, no vomiting.  No diarrhea.  No constipation. Genitourinary: Negative for dysuria. Musculoskeletal: Anterior chest wall pain Skin: Negative for rash. Neurological: Negative for headaches, focal weakness or numbness.    ____________________________________________   PHYSICAL EXAM:  VITAL SIGNS: ED Triage Vitals  Enc Vitals Group     BP 11/12/15 1411 (!) 151/61     Pulse Rate 11/12/15 1411 78     Resp 11/12/15 1411 16     Temp 11/12/15 1411 98.1 F (36.7 C)     Temp Source 11/12/15 1411 Oral     SpO2 11/12/15 1411 99 %     Weight 11/12/15 1411 185 lb (83.9 kg)     Height 11/12/15 1411 6\' 3"  (1.905 m)     Head Circumference --  Peak Flow --      Pain Score 11/12/15 1412 5     Pain Loc --      Pain Edu? --      Excl. in GC? --     Constitutional: Alert and oriented. Well appearing and in no acute distress. Eyes: Conjunctivae are normal. PERRL. EOMI. Head: Atraumatic. Nose: No congestion/rhinnorhea. Mouth/Throat: Mucous membranes are moist.  Oropharynx non-erythematous. Neck: No stridor. No cervical spine tenderness to palpation. Hematological/Lymphatic/Immunilogical: No cervical lymphadenopathy. Cardiovascular: Normal rate, regular rhythm. Grossly normal heart sounds.  Good peripheral  circulation. Respiratory: Normal respiratory effort.  No retractions. Lungs CTAB. Gastrointestinal: Soft and nontender. No distention. No abdominal bruits. No CVA tenderness. Musculoskeletal: No lower extremity tenderness nor edema.  No joint effusions. Neurologic:  Normal speech and language. No gross focal neurologic deficits are appreciated. No gait instability. Skin:  Skin is warm, dry and intact. No rash noted. Psychiatric: Mood and affect are normal. Speech and behavior are normal.  ____________________________________________   LABS (all labs ordered are listed, but only abnormal results are displayed)  Labs Reviewed - No data to display ____________________________________________  EKG   ____________________________________________  RADIOLOGY  No acute findings on chest x-ray. ____________________________________________   PROCEDURES  Procedure(s) performed: None  Procedures  Critical Care performed: No  ____________________________________________   INITIAL IMPRESSION / ASSESSMENT AND PLAN / ED COURSE  Pertinent labs & imaging results that were available during my care of the patient were reviewed by me and considered in my medical decision making (see chart for details).  Costochondritis secondary to cough. Discussed neck x-ray finding with patient. Patient was lidocaine given discharge care instructions. Patient given a prescription for Phenergan DM and ibuprofen. Patient advised [facility] condition [facility].  Clinical Course     ____________________________________________   FINAL CLINICAL IMPRESSION(S) / ED DIAGNOSES  Final diagnoses:  Sore throat  Viral URI with cough  Costochondritis      NEW MEDICATIONS STARTED DURING THIS VISIT:  New Prescriptions   LIDOCAINE (XYLOCAINE) 2 % SOLUTION    Use as directed 5 mLs in the mouth or throat every 6 (six) hours as needed for mouth pain. 5 ML's   NAPROXEN (NAPROSYN) 500 MG  TABLET    Take 1 tablet (500 mg total) by mouth 2 (two) times daily with a meal.   PROMETHAZINE-DEXTROMETHORPHAN (PROMETHAZINE-DM) 6.25-15 MG/5ML SYRUP    Take 5 mLs by mouth 4 (four) times daily as needed for cough. Mixed with 5 mL of viscous lidocaine swish and swallow.     Note:  This document was prepared using Dragon voice recognition software and may include unintentional dictation errors.    Joni Reining, PA-C 11/12/15 1609    Willy Eddy, MD 11/12/15 [phone]

## 2015-12-04 ENCOUNTER — Emergency Department
Admission: EM | Admit: 2015-12-04 | Discharge: 2015-12-04 | Disposition: A | Discharge status: Home / Self Care | Payer: Self-pay | Attending: Emergency Medicine | Admitting: Emergency Medicine

## 2015-12-04 ENCOUNTER — Encounter: Payer: Self-pay | Admitting: Emergency Medicine

## 2015-12-04 DIAGNOSIS — Z79899 Other long term (current) drug therapy: Secondary | ICD-10-CM | POA: Insufficient documentation

## 2015-12-04 DIAGNOSIS — R059 Cough, unspecified: Secondary | ICD-10-CM

## 2015-12-04 DIAGNOSIS — Z791 Long term (current) use of non-steroidal anti-inflammatories (NSAID): Secondary | ICD-10-CM | POA: Insufficient documentation

## 2015-12-04 DIAGNOSIS — F17210 Nicotine dependence, cigarettes, uncomplicated: Secondary | ICD-10-CM | POA: Insufficient documentation

## 2015-12-04 DIAGNOSIS — Z720 Tobacco use: Secondary | ICD-10-CM

## 2015-12-04 DIAGNOSIS — R0982 Postnasal drip: Secondary | ICD-10-CM

## 2015-12-04 DIAGNOSIS — R05 Cough: Secondary | ICD-10-CM

## 2015-12-04 MED ORDER — ALBUTEROL SULFATE HFA 108 (90 BASE) MCG/ACT IN AERS: 1.0000 | INHALATION_SPRAY | Freq: Four times a day (QID) | RESPIRATORY_TRACT | 0 refills | Status: DC | PRN

## 2015-12-04 MED ORDER — LORATADINE 10 MG PO TABS: 10.0000 mg | ORAL_TABLET | Freq: Every day | ORAL | 0 refills | Status: DC

## 2015-12-04 NOTE — ED Triage Notes (Signed)
Says has had cougn and sob on and off.  No fever.  Pt in nad.

## 2015-12-04 NOTE — ED Notes (Signed)
PA at bedside.

## 2015-12-04 NOTE — ED Provider Notes (Signed)
[facility] Emergency Department Provider Note  ____________________________________________  Time seen: Approximately 1:02 PM  I have reviewed the triage vital signs and the nursing notes.   HISTORY  Chief Complaint Shortness of Breath and Cough    HPI Shawn Leblanc is a 30 y.o. male, NAD, presents to the emergency department for a one week history of intermittent non-productive cough. He reports the cough is worse in the evening and after smoking. Has a tickle in his throat at times. He associates the cough with occassional shortness of breath and a tight "inflammed feeling" in his chest and throat. He denies any difficulty breathing, fever, chills, nasal or sinus congestion, sore throat, ear pain, chest pain, abdominal pain, nausea, vomiting, or diarrhea. He has not taken anything over the counter to help with these symptoms as he states the symptoms were not severe enough. He was seen here 3 weeks ago and diagnosed with a viral upper respiratory illness. Chest xray at that time was negative. Was given prescriptions for cough syrup and states he did not fill the prescription, again stating his symptoms were not severe enough. Patient endorses a 8 pack year history of smoking. Reports he has never been diagnosed with asthma or COPD.   Past Medical History:  Diagnosis Date  . Anxiety   . Umbilical hernia     There are no active problems to display for this patient.   History reviewed. No pertinent surgical history.  Prior to Admission medications   Medication Sig Start Date End Date Taking? Authorizing Provider  albuterol (PROVENTIL HFA;VENTOLIN HFA) 108 (90 Base) MCG/ACT inhaler Inhale 1-2 puffs into the lungs every 6 (six) hours as needed for wheezing or shortness of breath. 12/04/15   Jami L Hagler, PA-C  amoxicillin (AMOXIL) 500 MG tablet Take 1 tablet (500 mg total) by mouth 2 (two) times daily. 11/19/14   Charmayne Sheerharles M Beers, PA-C  clonazePAM  (KLONOPIN) 0.5 MG tablet Take 0.5 mg by mouth 2 (two) times daily as needed. For anxiety    Historical Provider, MD  fluticasone (FLONASE) 50 MCG/ACT nasal spray Place 2 sprays into both nostrils daily. 03/08/15   Jenise V Bacon Menshew, PA-C  lidocaine (XYLOCAINE) 2 % solution Use as directed 5 mLs in the mouth or throat every 6 (six) hours as needed for mouth pain. 5 ML's 11/12/15   Joni Reiningonald K Smith, PA-C  loratadine (CLARITIN) 10 MG tablet Take 1 tablet (10 mg total) by mouth daily. 12/04/15   Jami L Hagler, PA-C  LORazepam (ATIVAN) 1 MG tablet Take 1 tablet (1 mg total) by mouth 2 (two) times daily. 05/21/15 05/20/16  Emily FilbertJonathan E Williams, MD  naproxen (NAPROSYN) 500 MG tablet Take 1 tablet (500 mg total) by mouth 2 (two) times daily with a meal. 11/12/15   Joni Reiningonald K Smith, PA-C  promethazine-dextromethorphan (PROMETHAZINE-DM) 6.25-15 MG/5ML syrup Take 5 mLs by mouth 4 (four) times daily as needed for cough. Mixed with 5 mL of viscous lidocaine swish and swallow. 11/12/15   Joni Reiningonald K Smith, PA-C    Allergies Patient has no known allergies.  No family history on file.  Social History Social History  Substance Use Topics  . Smoking status: Current Every Day Smoker    Packs/day: 0.50    Types: Cigarettes  . Smokeless tobacco: Never Used  . Alcohol use No     Review of Systems  Constitutional: No fever/chills Eyes: No visual changes. No discharge ENT: Positive postnasal drip. No sore throat, Ear pain, sinus  pressure, nasal congestion. Cardiovascular: No chest pain, palpitations. Respiratory: Positive for cough and shortness of breath. No wheezing.  Gastrointestinal: No abdominal pain.  No nausea, vomiting.  Musculoskeletal: Negative for general myalgias.  Skin: Negative for rash. Neurological: Negative for headaches, focal weakness or numbness. 10-point ROS otherwise negative  ____________________________________________   PHYSICAL EXAM:  VITAL SIGNS: ED Triage Vitals  Enc Vitals  Group     BP 12/04/15 1244 (!) 142/64     Pulse Rate 12/04/15 1244 83     Resp 12/04/15 1244 16     Temp 12/04/15 1244 98 F (36.7 C)     Temp Source 12/04/15 1244 Oral     SpO2 12/04/15 1244 99 %     Weight 12/04/15 1245 185 lb (83.9 kg)     Height 12/04/15 1245 6\' 3"  (1.905 m)     Head Circumference --      Peak Flow --      Pain Score --      Pain Loc --      Pain Edu? --      Excl. in GC? --      Constitutional: Alert and oriented. Well appearing and in no acute distress. Eyes: Conjunctivae are normal without icterus or injection.  Head: Atraumatic. ENT:      Ears: Left TM visualized without effusion, bulging, erythema. Right ear canal with soft and non-impacted cerumen but TM could not be visualized.        Nose: Mild congestion with trace clear rhinorrhea      Mouth/Throat: Mucous membranes are moist. Throat is moderately injected, but swollen. No exudate noted. Clear post nasal drainage noted in posterior pharynx.  Neck: No stridor.  Supple with full range of motion Hematological/Lymphatic/Immunilogical: No cervical lymphadenopathy. Cardiovascular: Normal rate, regular rhythm. Normal S1 and S2.  Lungs, rubs, gallops. Good peripheral circulation. Respiratory: Normal respiratory effort without tachypnea or retractions. Lungs CTAB with breath sounds noted in all lung fields. No wheeze, rhonchi, rales.  Neurologic:  Normal speech and language. No gross focal neurologic deficits are appreciated.  Skin:  Skin is warm, dry and intact. No rash noted. Psychiatric: Mood and affect are normal. Speech and behavior are normal. Patient exhibits appropriate insight and judgement.   ____________________________________________   LABS  None ____________________________________________  EKG  None ____________________________________________  RADIOLOGY   No results found.  ____________________________________________    PROCEDURES  Procedure(s) performed:  None   Procedures   Medications - No data to display   ____________________________________________   INITIAL IMPRESSION / ASSESSMENT AND PLAN / ED COURSE  Pertinent labs & imaging results that were available during my care of the patient were reviewed by me and considered in my medical decision making (see chart for details).  Clinical Course     Patient's diagnosis is consistent with post nasal drip causing cough with associated tobacco use. Patient will be discharged home with prescriptions for claritin and an albuterol inhaler to take as prescribed and instructions to aid in smoking cessation. [facility] symptoms persist past this treatment course. Patient is given ED precautions to return to the ED for any worsening or new symptoms.   ____________________________________________  FINAL CLINICAL IMPRESSION(S) / ED DIAGNOSES  Final diagnoses:  Cough  Postnasal drip  Tobacco use      NEW MEDICATIONS STARTED DURING THIS VISIT:  Discharge Medication List as of 12/04/2015  1:35 PM    START taking these medications   Details  loratadine (CLARITIN) 10 MG tablet Take 1 tablet (10 mg total) by mouth daily., Starting Thu 12/04/2015, Print             Ernestene Kiel [place], PA-C 12/04/15 1427    Governor Rooks, MD 12/04/15 1544

## 2015-12-13 ENCOUNTER — Encounter: Payer: Self-pay | Admitting: Emergency Medicine

## 2015-12-13 DIAGNOSIS — Z79899 Other long term (current) drug therapy: Secondary | ICD-10-CM | POA: Insufficient documentation

## 2015-12-13 DIAGNOSIS — K029 Dental caries, unspecified: Secondary | ICD-10-CM | POA: Insufficient documentation

## 2015-12-13 DIAGNOSIS — F17210 Nicotine dependence, cigarettes, uncomplicated: Secondary | ICD-10-CM | POA: Insufficient documentation

## 2015-12-13 DIAGNOSIS — K047 Periapical abscess without sinus: Secondary | ICD-10-CM | POA: Insufficient documentation

## 2015-12-13 NOTE — ED Triage Notes (Signed)
Pt reports toothache to lower left side of his mouth for 2 days; swelling along jawline; denies fever;

## 2015-12-14 ENCOUNTER — Emergency Department
Admission: EM | Admit: 2015-12-14 | Discharge: 2015-12-14 | Disposition: A | Discharge status: Home / Self Care | Payer: Self-pay | Attending: Emergency Medicine | Admitting: Emergency Medicine

## 2015-12-14 DIAGNOSIS — K047 Periapical abscess without sinus: Secondary | ICD-10-CM

## 2015-12-14 DIAGNOSIS — K029 Dental caries, unspecified: Secondary | ICD-10-CM

## 2015-12-14 DIAGNOSIS — K0889 Other specified disorders of teeth and supporting structures: Secondary | ICD-10-CM

## 2015-12-14 MED ORDER — AMOXICILLIN 500 MG PO CAPS
500.0000 mg | ORAL_CAPSULE | Freq: Once | ORAL | Status: AC
  Administered 2015-12-14: 500 mg via ORAL
  Filled 2015-12-14: qty 1

## 2015-12-14 MED ORDER — AMOXICILLIN 500 MG PO CAPS: 500.0000 mg | ORAL_CAPSULE | Freq: Three times a day (TID) | ORAL | 0 refills | Status: DC

## 2015-12-14 MED ORDER — HYDROCODONE-ACETAMINOPHEN 5-325 MG PO TABS: 1.0000 | ORAL_TABLET | ORAL | 0 refills | Status: DC | PRN

## 2015-12-14 MED ORDER — IBUPROFEN 600 MG PO TABS
600.0000 mg | ORAL_TABLET | Freq: Once | ORAL | Status: AC
  Administered 2015-12-14: 600 mg via ORAL
  Filled 2015-12-14: qty 1

## 2015-12-14 NOTE — Discharge Instructions (Signed)
As we discussed, you need to follow-up with a dentist at the next available opportunity.  Please take the full course of antibiotics as prescribed.  We provided for you a prescription for the best antibiotic possible under the financial restraints that you identified.  If you develop worsening symptoms including fever, worsening swelling, difficulty swallowing, difficulty breathing, or other symptoms that concern you, please return immediately to the emergency department.

## 2015-12-14 NOTE — ED Notes (Signed)
Pt waiting in flex waiting room; explained the delay; pt understands we will come for him in the waiting area when a treatment room is available; no complaints; no requests

## 2015-12-14 NOTE — ED Provider Notes (Signed)
[facility] Emergency Department Provider Note  ____________________________________________   First MD Initiated Contact with Patient 12/14/15 [phone]     (approximate)  I have reviewed the triage vital signs and the nursing notes.   HISTORY  Chief Complaint Dental Pain    HPI Shawn Leblanc is a 30 y.o. male with no significant past medical history who presents with gradually worsening dental pain and swelling over the last 2 days.  He is having some swelling on the outside of his left lower jaw but no swelling inside his mouth with which she is aware.  He denies fever/chills, chest pain, shortness of breath, nausea, vomiting, difficulty swallowing, difficulty breathing, difficulty speaking.  He does not have severe pain when he opens his mouth.  He states that he has had cavities for a long time and has had an initial evaluation at a distance esophagus but has not had any work done.  Eating and drinking makes the pain worse and nothing makes it better.   Past Medical History:  Diagnosis Date  . Anxiety   . Umbilical hernia     There are no active problems to display for this patient.   History reviewed. No pertinent surgical history.  Prior to Admission medications   Medication Sig Start Date End Date Taking? Authorizing Provider  clonazePAM (KLONOPIN) 0.5 MG tablet Take 0.5 mg by mouth 2 (two) times daily as needed. For anxiety   Yes Historical Provider, MD  amoxicillin (AMOXIL) 500 MG capsule Take 1 capsule (500 mg total) by mouth 3 (three) times daily. 12/14/15   Loleta Roseory Davida Falconi, MD  HYDROcodone-acetaminophen (NORCO/VICODIN) 5-325 MG tablet Take 1-2 tablets by mouth every 4 (four) hours as needed for moderate pain. 12/14/15   Loleta Roseory Cletis Clack, MD    Allergies Patient has no known allergies.  History reviewed. No pertinent family history.  Social History Social History  Substance Use Topics  . Smoking status: Current Every Day Smoker   Packs/day: 0.50    Types: Cigarettes  . Smokeless tobacco: Never Used  . Alcohol use No    Review of Systems Constitutional: No fever/chills Eyes: No visual changes. ENT: No sore throat. Difficulty swallowing.  Severe dental pain on the middle of the left lower jaw Cardiovascular: Denies chest pain. Respiratory: Denies shortness of breath. Gastrointestinal: No abdominal pain.  No nausea, no vomiting.  No diarrhea.  No constipation. Genitourinary: Negative for dysuria. Musculoskeletal: Negative for back pain. Skin: Negative for rash. Neurological: Negative for headaches, focal weakness or numbness.  10-point ROS otherwise negative.  ____________________________________________   PHYSICAL EXAM:  VITAL SIGNS: ED Triage Vitals  Enc Vitals Group     BP 12/13/15 2225 122/69     Pulse Rate 12/13/15 2225 81     Resp 12/13/15 2225 18     Temp 12/13/15 2225 97.9 F (36.6 C)     Temp Source 12/13/15 2225 Oral     SpO2 12/13/15 2225 98 %     Weight 12/13/15 2226 185 lb (83.9 kg)     Height 12/13/15 2226 6\' 3"  (1.905 m)     Head Circumference --      Peak Flow --      Pain Score 12/13/15 2226 7     Pain Loc --      Pain Edu? --      Excl. in GC? --     Constitutional: Alert and oriented. Well appearing and in no acute distress. Eyes: Conjunctivae are normal. PERRL. EOMI. Head:  Atraumatic. Nose: No congestion/rhinnorhea. Mouth/Throat: Mucous membranes are moist.  Oropharynx non-erythematous. The patient has multiple chronic dental caries and one left lower molar that has extensive caries as well as being cracked.  There is no obvious swelling right around the gums at the site of the tooth but this is consistent with his swelling along the outside of the mandible and tenderness to palpation.  The external swelling along the mandible is tender but not fluctuant.  There is no surrounding cellulitis and it does not spread other parts of his face.  He has no tenderness or induration  under his tongue with no signs of Ludwig angina or other intraoral abscess Neck: No stridor.  No meningeal signs.   Cardiovascular: Normal rate, regular rhythm. Good peripheral circulation. Grossly normal heart sounds. Respiratory: Normal respiratory effort.  No retractions. Lungs CTAB. Gastrointestinal: Soft and nontender. No distention.  Musculoskeletal: No lower extremity tenderness nor edema. No gross deformities of extremities. Neurologic:  Normal speech and language. No gross focal neurologic deficits are appreciated.  Skin:  Skin is warm, dry and intact. No rash noted. Psychiatric: Mood and affect are normal. Speech and behavior are normal.  ____________________________________________   LABS (all labs ordered are listed, but only abnormal results are displayed)  Labs Reviewed - No data to display ____________________________________________  EKG  None - EKG not ordered by ED physician ____________________________________________  RADIOLOGY   No results found.  ____________________________________________   PROCEDURES  Procedure(s) performed:   Procedures   Critical Care performed: No ____________________________________________   INITIAL IMPRESSION / ASSESSMENT AND PLAN / ED COURSE  Pertinent labs & imaging results that were available during my care of the patient were reviewed by me and considered in my medical decision making (see chart for details).  The patient appears to be developing a dental abscess of his left lower jaw.  He has no systemic signs of infection and no sign of emergent infection/abscess which as Ludwig's angina.  I explained to him my concern that he is developing an abscess in the jaw and my preference would be to put him on clindamycin. However, in spite of a goodrx.com coupon for about $25, patient states he cannot afford it and he needs a prescription for a $4 antibiotic.  I explained that the clindamycin would really be better but he  expresses reluctance that he would get the prescription filled at $25.  I explained to him that an alternative such as amoxicillin may help but he may get worse and he should return to the emergency department if his symptoms worsen.  I am giving him a prescription for amoxicillin 500 mg 3 times a day 10 days and encouraged him to follow up with a dentist as soon as possible.I reviewed the patient's prescription history over the last 12 months in the [place] Controlled Substances Database, and he has no prescriptions for narcotics filled within the last year.  ____________________________________________  FINAL CLINICAL IMPRESSION(S) / ED DIAGNOSES  Final diagnoses:  Dental caries  Dental abscess  Pain, dental     MEDICATIONS GIVEN DURING THIS VISIT:  Medications  amoxicillin (AMOXIL) capsule 500 mg (not administered)  ibuprofen (ADVIL,MOTRIN) tablet 600 mg (not administered)     NEW OUTPATIENT MEDICATIONS STARTED DURING THIS VISIT:  New Prescriptions   AMOXICILLIN (AMOXIL) 500 MG CAPSULE    Take 1 capsule (500 mg total) by mouth 3 (three) times daily.   HYDROCODONE-ACETAMINOPHEN (NORCO/VICODIN) 5-325 MG TABLET    Take 1-2 tablets by mouth every 4 (  four) hours as needed for moderate pain.    Modified Medications   No medications on file    Discontinued Medications   ALBUTEROL (PROVENTIL HFA;VENTOLIN HFA) 108 (90 BASE) MCG/ACT INHALER    Inhale 1-2 puffs into the lungs every 6 (six) hours as needed for wheezing or shortness of breath.   AMOXICILLIN (AMOXIL) 500 MG TABLET    Take 1 tablet (500 mg total) by mouth 2 (two) times daily.   FLUTICASONE (FLONASE) 50 MCG/ACT NASAL SPRAY    Place 2 sprays into both nostrils daily.   LIDOCAINE (XYLOCAINE) 2 % SOLUTION    Use as directed 5 mLs in the mouth or throat every 6 (six) hours as needed for mouth pain. 5 ML's   LORATADINE (CLARITIN) 10 MG TABLET    Take 1 tablet (10 mg total) by mouth daily.   LORAZEPAM (ATIVAN) 1 MG TABLET    Take 1  tablet (1 mg total) by mouth 2 (two) times daily.   NAPROXEN (NAPROSYN) 500 MG TABLET    Take 1 tablet (500 mg total) by mouth 2 (two) times daily with a meal.   PROMETHAZINE-DEXTROMETHORPHAN (PROMETHAZINE-DM) 6.25-15 MG/5ML SYRUP    Take 5 mLs by mouth 4 (four) times daily as needed for cough. Mixed with 5 mL of viscous lidocaine swish and swallow.     Note:  This document was prepared using Dragon voice recognition software and may include unintentional dictation errors.    Loleta Roseory Sera Hitsman, MD 12/14/15 [phone]

## 2016-01-12 ENCOUNTER — Emergency Department
Admission: EM | Admit: 2016-01-12 | Discharge: 2016-01-12 | Disposition: A | Discharge status: Home / Self Care | Payer: Self-pay | Attending: Emergency Medicine | Admitting: Emergency Medicine

## 2016-01-12 DIAGNOSIS — K047 Periapical abscess without sinus: Secondary | ICD-10-CM | POA: Insufficient documentation

## 2016-01-12 DIAGNOSIS — K029 Dental caries, unspecified: Secondary | ICD-10-CM | POA: Insufficient documentation

## 2016-01-12 DIAGNOSIS — F17210 Nicotine dependence, cigarettes, uncomplicated: Secondary | ICD-10-CM | POA: Insufficient documentation

## 2016-01-12 DIAGNOSIS — K0381 Cracked tooth: Secondary | ICD-10-CM | POA: Insufficient documentation

## 2016-01-12 MED ORDER — HYDROCODONE-ACETAMINOPHEN 5-325 MG PO TABS: 1.0000 | ORAL_TABLET | Freq: Four times a day (QID) | ORAL | 0 refills | Status: DC | PRN

## 2016-01-12 MED ORDER — AMOXICILLIN 500 MG PO CAPS: 500.0000 mg | ORAL_CAPSULE | Freq: Three times a day (TID) | ORAL | 0 refills | Status: AC

## 2016-01-12 MED ORDER — MELOXICAM 15 MG PO TABS: 15.0000 mg | ORAL_TABLET | Freq: Every day | ORAL | 0 refills | Status: AC

## 2016-01-12 NOTE — ED Provider Notes (Signed)
[facility] Emergency Department Provider Note  ____________________________________________  Time seen: Approximately 3:23 PM  I have reviewed the triage vital signs and the nursing notes.   HISTORY  Chief Complaint Dental Pain    HPI Shawn Leblanc is a 30 y.o. male that presents to the emergency room with right-sided dental pain for 2 days. Patient has not taken anything for pain. Patient denies swelling. Patient is having difficulty opening jaw. Eating and drinking makes the pain worse. Nothing makes the pain better. Patient denies fever, shortness of breath, nausea, vomiting, difficulty swallowing, difficulty speaking, change in voice. Patient is aware several cavities. Patient was recently seen in the emergency department for left-sided tooth pain. Patient has not followed up with a dentist. Patient has not seen a dentist in many years.   Past Medical History:  Diagnosis Date  . Anxiety   . Umbilical hernia     There are no active problems to display for this patient.   History reviewed. No pertinent surgical history.  Prior to Admission medications   Medication Sig Start Date End Date Taking? Authorizing Provider  amoxicillin (AMOXIL) 500 MG capsule Take 1 capsule (500 mg total) by mouth 3 (three) times daily. 01/12/16 01/22/16  Enid DerryAshley Maxemiliano Riel, PA-C  clonazePAM (KLONOPIN) 0.5 MG tablet Take 0.5 mg by mouth 2 (two) times daily as needed. For anxiety    Historical Provider, MD  HYDROcodone-acetaminophen (NORCO/VICODIN) 5-325 MG tablet Take 1 tablet by mouth every 6 (six) hours as needed for moderate pain. 01/12/16   Enid DerryAshley Joee Iovine, PA-C  meloxicam (MOBIC) 15 MG tablet Take 1 tablet (15 mg total) by mouth daily. 01/12/16 01/22/16  Enid DerryAshley Tavio Biegel, PA-C    Allergies Patient has no known allergies.  No family history on file.  Social History Social History  Substance Use Topics  . Smoking status: Current Every Day Smoker    Packs/day: 0.50     Types: Cigarettes  . Smokeless tobacco: Never Used  . Alcohol use No     Review of Systems  Constitutional: No fever/chills ENT: No upper respiratory complaints. Cardiovascular: No chest pain. Respiratory: No cough. No SOB. Gastrointestinal: No abdominal pain.  No nausea, no vomiting.  Musculoskeletal: Negative for musculoskeletal pain. Skin: Negative for rash, abrasions, lacerations, ecchymosis. Neurological: Negative for headaches.   ____________________________________________   PHYSICAL EXAM:  VITAL SIGNS: ED Triage Vitals  Enc Vitals Group     BP 01/12/16 1135 140/70     Pulse Rate 01/12/16 1135 96     Resp 01/12/16 1135 17     Temp 01/12/16 1135 97.5 F (36.4 C)     Temp Source 01/12/16 1135 Oral     SpO2 01/12/16 1135 100 %     Weight 01/12/16 1136 180 lb (81.6 kg)     Height 01/12/16 1136 6\' 3"  (1.905 m)     Head Circumference --      Peak Flow --      Pain Score 01/12/16 1136 6     Pain Loc --      Pain Edu? --      Excl. in GC? --      Constitutional: Alert and oriented. Well appearing and in no acute distress. Eyes: Conjunctivae are normal. PERRL. EOMI. Head: Atraumatic. ENT:      Ears: Excessive cerumen present. Tympanic membranes pearly gray with good landmarks.      Nose: No congestion/rhinnorhea.      Mouth/Throat: Mucous membranes are moist. Oropharynx non-erythematous. Uvula midline. Tonsils not  enlarged. Pain around back right molar. This tooth has extensive caries and is cracked and broken off in several places. Poor dentition throughout. Multiple extensive dental caries and several cracked teeth. No obvious swelling around the gums at the site of the tooth. No tenderness to palpation of jaw. Patient is able to open and close jaw without difficulty. No surrounding cellulitis and it does not spread to other parts of his face. No tenderness or induration under his tongue. No signs of Ludwig angina or other intraoral abscess. Neck: No  stridor. Cardiovascular: Normal rate, regular rhythm. Normal S1 and S2.  Good peripheral circulation. Respiratory: Normal respiratory effort without tachypnea or retractions. Lungs CTAB. Good air entry to the bases with no decreased or absent breath sounds. Musculoskeletal: Full range of motion to all extremities. No gross deformities appreciated. Neurologic:  Normal speech and language. No gross focal neurologic deficits are appreciated.  Skin:  Skin is warm, dry and intact. No rash noted. Psychiatric: Mood and affect are normal.    ____________________________________________   LABS (all labs ordered are listed, but only abnormal results are displayed)  Labs Reviewed - No data to display ____________________________________________  EKG   ____________________________________________  RADIOLOGY   No results found.  ____________________________________________    PROCEDURES  Procedure(s) performed:    Procedures    Medications - No data to display   ____________________________________________   INITIAL IMPRESSION / ASSESSMENT AND PLAN / ED COURSE  Pertinent labs & imaging results that were available during my care of the patient were reviewed by me and considered in my medical decision making (see chart for details).  Review of the [place] CSRS was performed in accordance of the [facility] prior to dispensing any controlled drugs.  Clinical Course     Patient's diagnosis is consistent with tooth infection. No systemic signs of infection. Patient will be discharged home with prescriptions for amoxicillin and meloxicam. Patient is to follow up with dentist immediately. Patient is given ED precautions to return to the ED for any worsening or new symptoms.  ____________________________________________  FINAL CLINICAL IMPRESSION(S) / ED DIAGNOSES  Final diagnoses:  Dental infection      NEW MEDICATIONS STARTED DURING THIS VISIT:  Discharge Medication List as of  01/12/2016 12:52 PM    START taking these medications   Details  meloxicam (MOBIC) 15 MG tablet Take 1 tablet (15 mg total) by mouth daily., Starting Mon 01/12/2016, Until Thu 01/22/2016, Print            This chart was dictated using voice recognition software/Dragon. Despite best efforts to proofread, errors can occur which can change the meaning. Any change was purely unintentional.    Enid DerryAshley Jahmani Staup, PA-C 01/12/16 1539    Minna AntisKevin Paduchowski, MD 01/13/16 [phone]

## 2016-01-12 NOTE — Discharge Instructions (Signed)
OPTIONS FOR DENTAL FOLLOW UP CARE ° °[place] Department of Health and Human Services - Local Safety Net Dental Clinics °http://www.ncdhhs.gov/dph/oralhealth/services/safetynetclinics.htm °  °[facility] (336-562-3123) ° °[facility] ([phone]) ° °[facility] ([phone]) ° °[facility] ([phone]) ° °[facility] ([phone]) °This clinic caters to the indigent population and is on a lottery system. °Location: °[facility], [address], [place] °Clinic Hours: °Wednesdays from 6pm - 9pm, patients seen by a lottery system. °For dates, call or go to [email or web address] °Services: °Cleanings, fillings and simple extractions. °Payment Options: °DENTAL WORK IS FREE OF CHARGE. Bring proof of income or support. °Best way to get seen: °Arrive at 5:15 pm - this is a lottery, NOT first come/first serve, so arriving earlier will not increase your chances of being seen. °  °  °[facility] °[phone] °Select option 1 for emergencies °  °Location: °[facility], [address], [place] °Clinic Hours: °No walk-ins accepted - call the day before to schedule an appointment. °Check in times are 9:30 am and 1:30 pm. °Services: °Simple extractions, temporary fillings, pulpectomy/pulp debridement, uncomplicated abscess drainage. °Payment Options: °PAYMENT IS DUE AT THE TIME OF SERVICE.  Fee is usually $100-200, additional surgical procedures (e.g. abscess drainage) may be extra. °Cash, checks, Visa/MasterCard accepted.  Can file Medicaid if patient is covered for dental - patient should call case worker to check. °No discount for [facility] patients. °Best way to get seen: °MUST call the day before and get onto the schedule. Can usually be seen the next 1-2 days. No walk-ins accepted. °  °  °[facility] °[phone] °   °Location: °[facility], [address], [place] °Clinic Hours: °M, W, Th, F 8am or 1:30pm, Tues 9a or 1:30 - first come/first served. °Services: °Simple extractions, temporary fillings, uncomplicated abscess drainage.  You do not need to be an [place] resident. °Payment Options: °PAYMENT IS DUE AT THE TIME OF SERVICE. °Dental insurance, otherwise sliding scale - bring proof of income or support. °Depending on income and treatment needed, cost is usually $50-200. °Best way to get seen: °Arrive early as it is first come/first served. °  °  °[facility] °[phone] °  °Location: °[address] °Clinic Hours: °Mon-Thu 8a-5p °Services: °Most basic dental services including extractions and fillings. °Payment Options: °PAYMENT IS DUE AT THE TIME OF SERVICE. °Sliding scale, up to 50% off - bring proof if income or support. °Medicaid with dental option accepted. °Best way to get seen: °Call to schedule an appointment, can usually be seen within 2 weeks OR they will try to see walk-ins - show up at 8a or 2p (you may have to wait). °  °  °[facility] °[phone] °[place] RESIDENTS ONLY °  °Location: °[facility], [address], [place],  [address] °Clinic Hours: By appointment only. °Monday - Thursday 8am-5pm, Friday 8am-12pm °Services: Cleanings, fillings, extractions. °Payment Options: °PAYMENT IS DUE AT THE TIME OF SERVICE. °Cash, Visa or MasterCard. Sliding scale - $30 minimum per service. °Best way to get seen: °Come in to office, complete packet and make an appointment - need proof of income °or support monies for each household member and proof of [place] residence. °Usually takes about a month to get in. °  °  °[facility] °[phone] °  °Location: °[address]   [place] °Clinic Hours: Walk-in Urgent Care Dental Services are offered Monday-Friday  mornings only. °The numbers of emergencies accepted daily is limited to the number of °providers available. °Maximum 15 - Mondays, Wednesdays & Thursdays °Maximum 10 - Tuesdays & Fridays °Services: °You do not need to be a [place] resident to be seen for a dental emergency. °Emergencies are defined as pain, swelling, abnormal bleeding, or dental trauma. Walkins will receive x-rays if needed. °NOTE: Dental cleaning is not an emergency. °Payment Options: °PAYMENT IS DUE AT THE TIME OF SERVICE. °Minimum co-pay is $40.00 for uninsured patients. °Minimum co-pay is $3.00 for Medicaid with dental coverage. °Dental Insurance is accepted and must be presented at time of visit. °Medicare does not cover dental. °Forms of payment: Cash, credit card, checks. °Best way to get seen: °If not previously registered with the clinic, walk-in dental registration begins at 7:15 am and is on a first come/first serve basis. °If previously registered with the clinic, call to make an appointment. °  °  °[facility] °[phone] °[place] RESIDENTS ONLY °  °Location: °[address], [place], [place] °Clinic Hours: °Mon-Thu 10a-2p °Services: Extractions only! °Payment Options: °FREE (donations accepted) - bring proof of income or support °Best way to get seen: °Call and schedule an appointment OR come at 8am on the 1st Monday of every month (except for holidays) when it is first come/first served. °  °  °[facility] °[phone] °  °Location: °[address], [place] °Clinic Hours: °Friday mornings °Services, Payment Options, Best way to get seen: °Call for info °

## 2016-01-12 NOTE — ED Triage Notes (Signed)
Pt c/o right lower tooth ache 

## 2016-01-18 ENCOUNTER — Emergency Department: Payer: Self-pay

## 2016-01-18 ENCOUNTER — Encounter: Payer: Self-pay | Admitting: Emergency Medicine

## 2016-01-18 ENCOUNTER — Emergency Department
Admission: EM | Admit: 2016-01-18 | Discharge: 2016-01-18 | Disposition: A | Discharge status: Home / Self Care | Payer: Self-pay | Attending: Emergency Medicine | Admitting: Emergency Medicine

## 2016-01-18 DIAGNOSIS — J111 Influenza due to unidentified influenza virus with other respiratory manifestations: Secondary | ICD-10-CM

## 2016-01-18 DIAGNOSIS — F17210 Nicotine dependence, cigarettes, uncomplicated: Secondary | ICD-10-CM | POA: Insufficient documentation

## 2016-01-18 DIAGNOSIS — R509 Fever, unspecified: Secondary | ICD-10-CM | POA: Insufficient documentation

## 2016-01-18 DIAGNOSIS — R69 Illness, unspecified: Secondary | ICD-10-CM

## 2016-01-18 DIAGNOSIS — Z79899 Other long term (current) drug therapy: Secondary | ICD-10-CM | POA: Insufficient documentation

## 2016-01-18 DIAGNOSIS — R52 Pain, unspecified: Secondary | ICD-10-CM | POA: Insufficient documentation

## 2016-01-18 DIAGNOSIS — R05 Cough: Secondary | ICD-10-CM | POA: Insufficient documentation

## 2016-01-18 DIAGNOSIS — J029 Acute pharyngitis, unspecified: Secondary | ICD-10-CM | POA: Insufficient documentation

## 2016-01-18 MED ORDER — GUAIFENESIN-CODEINE 100-10 MG/5ML PO SYRP: 10.0000 mL | ORAL_SOLUTION | Freq: Four times a day (QID) | ORAL | 0 refills | Status: DC | PRN

## 2016-01-18 MED ORDER — NAPROXEN 500 MG PO TABS: 500.0000 mg | ORAL_TABLET | Freq: Two times a day (BID) | ORAL | 0 refills | Status: DC

## 2016-01-18 NOTE — ED Provider Notes (Signed)
[facility] Emergency Department Provider Note  ____________________________________________  Time seen: Approximately 1:37 PM  I have reviewed the triage vital signs and the nursing notes.   HISTORY  Chief Complaint Fever and Generalized Body Aches   HPI Shawn Leblanc is a 30 y.o. male who presents to the emergency department for evaluation of cough, body aches, and fever that started last night. He states that his fever has been as high as 102. He states that he has taken ibuprofen and Tylenol with relief of the fever. States that he has a harsh cough that is nonproductive. No known exposures.   Past Medical History:  Diagnosis Date  . Anxiety   . Umbilical hernia     There are no active problems to display for this patient.   History reviewed. No pertinent surgical history.  Prior to Admission medications   Medication Sig Start Date End Date Taking? Authorizing Provider  amoxicillin (AMOXIL) 500 MG capsule Take 1 capsule (500 mg total) by mouth 3 (three) times daily. 01/12/16 01/22/16  Enid DerryAshley Wagner, PA-C  clonazePAM (KLONOPIN) 0.5 MG tablet Take 0.5 mg by mouth 2 (two) times daily as needed. For anxiety    Historical Provider, MD  guaiFENesin-codeine (ROBITUSSIN AC) 100-10 MG/5ML syrup Take 10 mLs by mouth 4 (four) times daily as needed for cough. 01/18/16   Chinita Pesterari B Ricquel Foulk, FNP  HYDROcodone-acetaminophen (NORCO/VICODIN) 5-325 MG tablet Take 1 tablet by mouth every 6 (six) hours as needed for moderate pain. 01/12/16   Enid DerryAshley Wagner, PA-C  meloxicam (MOBIC) 15 MG tablet Take 1 tablet (15 mg total) by mouth daily. 01/12/16 01/22/16  Enid DerryAshley Wagner, PA-C  naproxen (NAPROSYN) 500 MG tablet Take 1 tablet (500 mg total) by mouth 2 (two) times daily with a meal. 01/18/16   Chinita Pesterari B Ryzen Deady, FNP    Allergies Patient has no known allergies.  History reviewed. No pertinent family history.  Social History Social History  Substance Use Topics  .  Smoking status: Current Every Day Smoker    Packs/day: 0.50    Types: Cigarettes  . Smokeless tobacco: Never Used  . Alcohol use No    Review of Systems Constitutional: Positive for fever/chills ENT: Positive for sore throat. Cardiovascular: Denies chest pain. Respiratory: Negative for shortness of breath. Positive for cough. Gastrointestinal: Negative for nausea,  no vomiting.  Negative for diarrhea.  Musculoskeletal: Positive for body aches Skin: Negative for rash. Neurological: Positive for headaches ____________________________________________   PHYSICAL EXAM:  VITAL SIGNS: ED Triage Vitals  Enc Vitals Group     BP 01/18/16 1237 (!) 122/57     Pulse Rate 01/18/16 1237 (!) 107     Resp 01/18/16 1237 18     Temp 01/18/16 1237 99.8 F (37.7 C)     Temp Source 01/18/16 1237 Oral     SpO2 01/18/16 1237 97 %     Weight 01/18/16 1237 180 lb (81.6 kg)     Height 01/18/16 1237 6\' 3"  (1.905 m)     Head Circumference --      Peak Flow --      Pain Score 01/18/16 1238 6     Pain Loc --      Pain Edu? --      Excl. in GC? --     Constitutional: Alert and oriented. Acutely ill appearing and in no acute distress. Eyes: Conjunctivae are normal. EOMI. Ears: Bilateral tympanic membranes with serous fluid. Nose: Maxillary sinus congestion; clear rhinnorhea. Mouth/Throat: Mucous membranes are moist.  Oropharynx erythematous. Tonsils appear 2+ bilaterally without exudate. Neck: No stridor.  Lymphatic: Anterior cervical lymphadenopathy bilaterally. Cardiovascular: Normal rate, regular rhythm. Grossly normal heart sounds.  Good peripheral circulation. Respiratory: Normal respiratory effort.  No retractions. Clear to auscultation. Gastrointestinal: Soft and nontender.  Musculoskeletal: FROM x 4 extremities.  Neurologic:  Normal speech and language.  Skin:  Skin is warm, dry and intact. No rash noted. Psychiatric: Mood and affect are normal. Speech and behavior are  normal.  ____________________________________________   LABS (all labs ordered are listed, but only abnormal results are displayed)  Labs Reviewed - No data to display ____________________________________________  EKG   ____________________________________________  RADIOLOGY  No acute abnormality per radiology. ____________________________________________   PROCEDURES  Procedure(s) performed:   Critical Care performed: No  ____________________________________________   INITIAL IMPRESSION / ASSESSMENT AND PLAN / ED COURSE  Clinical Course     Pertinent labs & imaging results that were available during my care of the patient were reviewed by me and considered in my medical decision making (see chart for details).   30 year old male presenting to the emergency department for evaluation of body aches, fever, chills, cough, and headache. Chest x-ray without evidence of acute abnormality per radiology. Symptoms are consistent with a flulike illness. He was given a prescription for Robitussin before meals and Naprosyn. He was encouraged to also take Tylenol if needed for body aches or fever. He is instructed to follow up with the primary care provider of his choice for symptoms that are not improving over the next few days. He was instructed to return to the emergency department for symptoms that change or worsen if he is unable schedule an appointment. ____________________________________________   FINAL CLINICAL IMPRESSION(S) / ED DIAGNOSES  Final diagnoses:  Influenza-like illness    Note:  This document was prepared using Dragon voice recognition software and may include unintentional dictation errors.     Chinita PesterCari B Lorenza Winkleman, FNP 01/18/16 1402    Jene Everyobert Kinner, MD 01/18/16 [phone]

## 2016-01-18 NOTE — ED Triage Notes (Signed)
Pt states fever and body aches, cough,

## 2016-01-18 NOTE — ED Triage Notes (Signed)
States currently on antibiotic for dental infection.

## 2016-01-18 NOTE — Discharge Instructions (Signed)
You may also take tylenol in addition to the medications prescribed. Follow the directions on the bottle.  Follow up with the PCP of your choice for symptoms that are not improving over the next few days. Return to the ER for symptoms that change or worsen if unable to schedule an appointment.

## 2016-02-20 ENCOUNTER — Emergency Department
Admission: EM | Admit: 2016-02-20 | Discharge: 2016-02-20 | Disposition: A | Discharge status: Home / Self Care | Payer: Self-pay | Attending: Student in an Organized Health Care Education/Training Program | Admitting: Student in an Organized Health Care Education/Training Program

## 2016-02-20 DIAGNOSIS — K029 Dental caries, unspecified: Secondary | ICD-10-CM | POA: Insufficient documentation

## 2016-02-20 DIAGNOSIS — F17210 Nicotine dependence, cigarettes, uncomplicated: Secondary | ICD-10-CM | POA: Insufficient documentation

## 2016-02-20 DIAGNOSIS — K0889 Other specified disorders of teeth and supporting structures: Secondary | ICD-10-CM

## 2016-02-20 MED ORDER — AMOXICILLIN 500 MG PO CAPS: 500.0000 mg | ORAL_CAPSULE | Freq: Three times a day (TID) | ORAL | 0 refills | Status: DC

## 2016-02-20 MED ORDER — IBUPROFEN 800 MG PO TABS: 800.0000 mg | ORAL_TABLET | Freq: Three times a day (TID) | ORAL | 0 refills | Status: DC | PRN

## 2016-02-20 NOTE — ED Triage Notes (Signed)
C/O right lower jaw pain / tooth abscess.

## 2016-02-20 NOTE — Discharge Instructions (Signed)
Urinalysis of follow-up for this to dental clinics provided. OPTIONS FOR DENTAL FOLLOW UP CARE  [place] Department of Health and Human Services - Local Safety Net Dental Clinics TripDoors.comhttp://www.ncdhhs.gov/dph/oralhealth/services/safetynetclinics.htm   [facility] [phone])  [facility] [place] [phone])  [facility] [place] [phone])  [facility] [phone])  [facility] [phone]) This clinic caters to the indigent population and is on a lottery system. Location: [employer] [facility], [address], [place] [facility] Hours: Wednesdays from 6pm - 9pm, patients seen by a lottery system. For dates, call or go to [email or web address] Services: Cleanings, fillings and simple extractions. Payment Options: DENTAL WORK IS FREE OF CHARGE. Bring proof of income or support. Best way to get seen: Arrive at 5:15 pm - this is a lottery, NOT first come/first serve, so arriving earlier will not increase your chances of being seen.     [facility] [phone] Select option 1 for emergencies   [facility]: [facility], [address], [place] [facility] Hours: No walk-ins accepted - call the day before to schedule an appointment. Check in times are 9:30 am and 1:30 pm. Services: Simple extractions, temporary fillings, pulpectomy/pulp debridement, uncomplicated abscess drainage. Payment Options: PAYMENT IS DUE AT THE TIME OF SERVICE.  Fee is usually $100-200, additional surgical procedures (e.g. abscess drainage) may be extra. Cash, checks, Visa/MasterCard accepted.  Can file Medicaid if patient is covered for dental - patient should call case worker to check. No discount for [facility] patients. Best way to get seen: MUST call the day before and get onto the schedule. Can usually be seen the next 1-2 days. No walk-ins  accepted.     [facility] [phone]   Location: [facility], [address], [place] Clinic Hours: M, W, Th, F 8am or 1:30pm, Tues 9a or 1:30 - first come/first served. Services: Simple extractions, temporary fillings, uncomplicated abscess drainage.  You do not need to [place]. Payment Options: PAYMENT IS DUE AT THE TIME OF SERVICE. Dental insurance, otherwise sliding scale - bring proof of income or support. Depending on income and treatment needed, cost is usually $50-200. Best way to get seen: Arrive early as it is first come/first served.     [facility] [phone]   Location: [address] Clinic Hours: Mon-Thu 8a-5p Services: Most basic dental services including extractions and fillings. Payment Options: PAYMENT IS DUE AT THE TIME OF SERVICE. Sliding scale, up to 50% off - bring proof if income or support. Medicaid with dental option accepted. Best way to get seen: Call to schedule an appointment, can usually be seen within 2 weeks OR they will try to see walk-ins - show up at 8a or 2p (you may have to wait).     [facility] [phone] [place]IDENTS ONLY   Location: [facility], [address], [place], [place] [address] Clinic Hours: By appointment only. Monday - Thursday 8am-5pm, Friday 8am-12pm Services: Cleanings, fillings, extractions. Payment Options: PAYMENT IS DUE AT THE TIME OF SERVICE. Cash, Visa or MasterCard. Sliding scale - $30 minimum per service. Best way to get seen: Come in to office, complete packet and make an appointment - need proof of income or support monies for each household member and proof of [place] residence. Usually takes about a month to get in.     [facility] [phone]   Location: [address] [facility] Hours: Walk-in  Urgent Care Dental Services are offered Monday-Friday mornings only. The numbers of emergencies accepted daily is limited to the number of providers available. Maximum 15 - Mondays, Wednesdays & Thursdays Maximum 10 - Tuesdays & Fridays Services: You do not need to be a [facility] resident to be seen for a dental emergency. Emergencies are defined as pain, swelling, abnormal bleeding, or dental trauma. Walkins will receive x-rays if needed. NOTE: Dental cleaning is not an emergency. Payment Options: PAYMENT IS DUE AT THE TIME OF SERVICE. Minimum co-pay is $40.00 for uninsured patients. Minimum co-pay is $3.00 for Medicaid with dental coverage. Dental Insurance is accepted and must be presented at time of visit. Medicare does not cover dental. Forms of payment: Cash, credit card, checks. Best way to get seen: If not previously registered with the clinic, walk-in dental registration begins at 7:15 am and is on a first come/first serve basis. If previously registered with the clinic, call to make an appointment.     [facility] [phone] [place] RESIDENTS ONLY   Location: [address] [place], [place] Clinic Hours: Mon-Thu 10a-2p Services: Extractions only! Payment Options: FREE (donations accepted) - bring proof of income or support Best way to get seen: Call and schedule an appointment OR come at 8am on the 1st Monday of every month (except for holidays) when it is first come/first served.     [facility] [phone]   Location: [address] [place], [place] Clinic Hours: Friday mornings Services, Payment Options, Best way to get seen: Call for info

## 2016-02-20 NOTE — ED Provider Notes (Signed)
[facility] Emergency Department Provider Note   ____________________________________________   None    (approximate)  I have reviewed the triage vital signs and the nursing notes.   HISTORY  Chief Complaint Dental Pain    HPI Shawn Leblanc is a 31 y.o. male patient complain of right lower jaw pain secondary to devitalize tooth. This patient third visit in 3 months for this complaint. Patient state unable to afford the co-pay for his dental plan. Patient state he is awaiting tax return and oriented to have teeth treated. Patient rated his pain as a 5/10. Patient denies any fever associated this complaint. Patient able to tolerate food and fluids. Patient describes pain as "sharp". No palliative measures for this complaint.  Past Medical History:  Diagnosis Date  . Anxiety   . Umbilical hernia     There are no active problems to display for this patient.   No past surgical history on file.  Prior to Admission medications   Medication Sig Start Date End Date Taking? Authorizing Provider  amoxicillin (AMOXIL) 500 MG capsule Take 1 capsule (500 mg total) by mouth 3 (three) times daily. 02/20/16   Joni Reining, PA-C  clonazePAM (KLONOPIN) 0.5 MG tablet Take 0.5 mg by mouth 2 (two) times daily as needed. For anxiety    Historical Provider, MD  guaiFENesin-codeine (ROBITUSSIN AC) 100-10 MG/5ML syrup Take 10 mLs by mouth 4 (four) times daily as needed for cough. 01/18/16   Chinita Pester, FNP  HYDROcodone-acetaminophen (NORCO/VICODIN) 5-325 MG tablet Take 1 tablet by mouth every 6 (six) hours as needed for moderate pain. 01/12/16   Enid Derry, PA-C  ibuprofen (ADVIL,MOTRIN) 800 MG tablet Take 1 tablet (800 mg total) by mouth every 8 (eight) hours as needed. 02/20/16   Joni Reining, PA-C  naproxen (NAPROSYN) 500 MG tablet Take 1 tablet (500 mg total) by mouth 2 (two) times daily with a meal. 01/18/16   Chinita Pester, FNP    Allergies Patient  has no known allergies.  No family history on file.  Social History Social History  Substance Use Topics  . Smoking status: Current Every Day Smoker    Packs/day: 0.50    Types: Cigarettes  . Smokeless tobacco: Never Used  . Alcohol use No    Review of Systems Constitutional: No fever/chills Eyes: No visual changes. ENT: No sore throat.Dental pain Cardiovascular: Denies chest pain. Respiratory: Denies shortness of breath. Gastrointestinal: No abdominal pain.  No nausea, no vomiting.  No diarrhea.  No constipation. Genitourinary: Negative for dysuria. Musculoskeletal: Negative for back pain. Skin: Negative for rash. Neurological: Negative for headaches, focal weakness or numbness.    ____________________________________________   PHYSICAL EXAM:  VITAL SIGNS: ED Triage Vitals [02/20/16 1417]  Enc Vitals Group     BP (!) 143/67     Pulse Rate 100     Resp 16     Temp 98 F (36.7 C)     Temp Source Oral     SpO2 98 %     Weight 180 lb (81.6 kg)     Height 6\' 3"  (1.905 m)     Head Circumference      Peak Flow      Pain Score 8     Pain Loc      Pain Edu?      Excl. in GC?     Constitutional: Alert and oriented. Well appearing and in no acute distress. Eyes: Conjunctivae are normal. PERRL. EOMI.  Head: Atraumatic. Nose: No congestion/rhinnorhea. Mouth/Throat: Mucous membranes are moist.  Oropharynx non-erythematous.Multiple current some fractured teeth upper and lower molars bilaterally. Mild gingival edema. Neck: No stridor.  No cervical spine tenderness to palpation. Hematological/Lymphatic/Immunilogical: No cervical lymphadenopathy. Cardiovascular: Normal rate, regular rhythm. Grossly normal heart sounds.  Good peripheral circulation. Respiratory: Normal respiratory effort.  No retractions. Lungs CTAB. Gastrointestinal: Soft and nontender. No distention. No abdominal bruits. No CVA tenderness. Musculoskeletal: No lower extremity tenderness nor edema.  No  joint effusions. Neurologic:  Normal speech and language. No gross focal neurologic deficits are appreciated. No gait instability. Skin:  Skin is warm, dry and intact. No rash noted. Psychiatric: Mood and affect are normal. Speech and behavior are normal.  ____________________________________________   LABS (all labs ordered are listed, but only abnormal results are displayed)  Labs Reviewed - No data to display ____________________________________________  EKG   ____________________________________________  RADIOLOGY   ____________________________________________   PROCEDURES  Procedure(s) performed: None  Procedures  Critical Care performed: No  ____________________________________________   INITIAL IMPRESSION / ASSESSMENT AND PLAN / ED COURSE  Pertinent labs & imaging results that were available during my care of the patient were reviewed by me and considered in my medical decision making (see chart for details).  Dental pain secondary to multiple dental caries. This complaint is chronic in nature. Patient stopped complied with discharged treatment plans previous visits. Patient given prescriptions for ibuprofen and amoxicillin. Patient advised follow-up from the list of dental clinics provided a [facility] for further      ____________________________________________   FINAL CLINICAL IMPRESSION(S) / ED DIAGNOSES  Final diagnoses:  Toothache      NEW MEDICATIONS STARTED DURING THIS VISIT:  New Prescriptions   AMOXICILLIN (AMOXIL) 500 MG CAPSULE    Take 1 capsule (500 mg total) by mouth 3 (three) times daily.   IBUPROFEN (ADVIL,MOTRIN) 800 MG TABLET    Take 1 tablet (800 mg total) by mouth every 8 (eight) hours as needed.     Note:  This document was prepared using Dragon voice recognition software and may include unintentional dictation errors.    Joni ReiningRonald K Smith, PA-C 02/20/16 1535    Willy EddyPatrick Robinson, MD 02/20/16 [phone]

## 2016-02-20 NOTE — ED Notes (Signed)
Patient c/o possible abscess to lower right wisdom tooth

## 2016-02-20 NOTE — ED Notes (Signed)
Patient reports he took 600 mg ibuprofen at approx 1100 today, with minimal relief of pain

## 2016-04-06 IMAGING — CT CT NECK W/ CM
1 of 4 series · 4 of 14 positions shown, 5 images · IV contrast (omnipaque)
Comparison: 12/16/2011 head CT

CLINICAL DATA: 29-year-old male with right neck pain and pressure
for 2 weeks.

EXAM:
CT NECK WITH CONTRAST
TECHNIQUE: Multidetector CT imaging of the neck was performed using the
standard protocol following the bolus administration of intravenous
contrast.
CONTRAST:  100mL OMNIPAQUE IOHEXOL 300 MG/ML  SOLN

[Series 3: neck thins · axial · 0.58mm/px · z∈[-245,-88]mm · 4 of 263 slices shown, 5 images]
[im 53/263  soft-tissue]
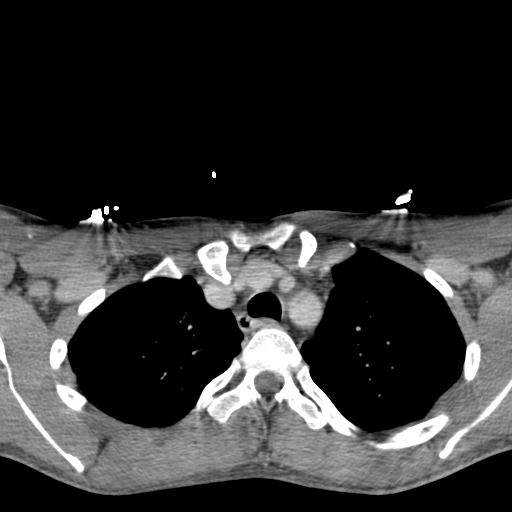
[im 53/263  bone]
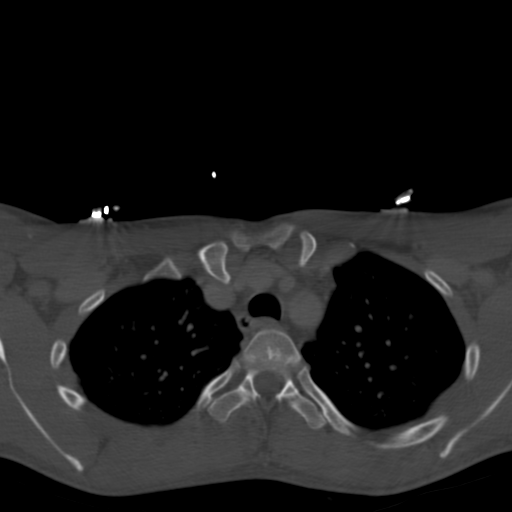
[im 105/263  bone]
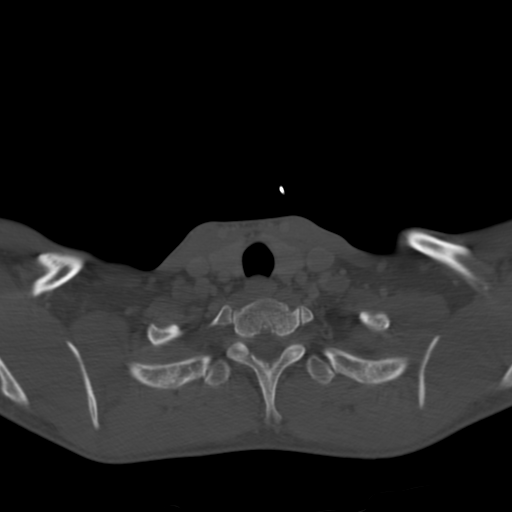
[im 158/263  bone]
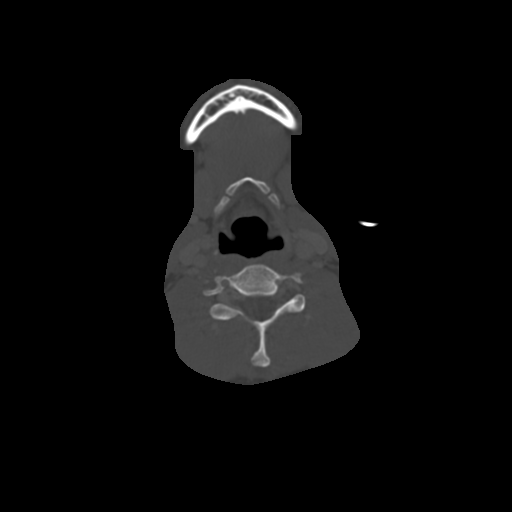
[im 210/263  bone]
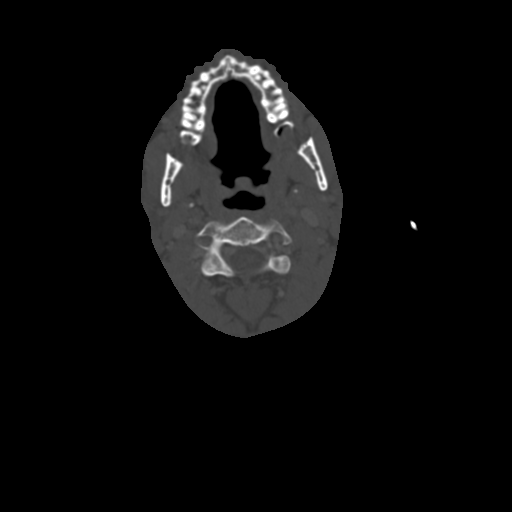

[4 of 14 positions shown; findings below may reference images not displayed]

FINDINGS: Pharynx and larynx: Unremarkable. No evidence of focal thickening,
mass or abscess.

Salivary glands: Unremarkable

Thyroid: Unremarkable

Lymph nodes: No enlarged or abnormal appearing lymph nodes.

Vascular: Unremarkable.

Limited intracranial: Unremarkable

Visualized orbits: Unremarkable

Mastoids and visualized paranasal sinuses: Unremarkable

Skeleton: Scattered caries in several teeth identified. Periapical
abscesses of teeth #1, 16, 18 and 31

Upper chest: Unremarkable.
IMPRESSION: No acute abnormalities.

Dental caries and periapical abscesses as described.

## 2016-10-15 ENCOUNTER — Encounter: Payer: Self-pay | Admitting: Emergency Medicine

## 2016-10-15 ENCOUNTER — Emergency Department: Payer: Self-pay

## 2016-10-15 ENCOUNTER — Emergency Department
Admission: EM | Admit: 2016-10-15 | Discharge: 2016-10-15 | Disposition: A | Discharge status: Home / Self Care | Payer: Self-pay | Attending: Emergency Medicine | Admitting: Emergency Medicine

## 2016-10-15 DIAGNOSIS — Z79899 Other long term (current) drug therapy: Secondary | ICD-10-CM | POA: Insufficient documentation

## 2016-10-15 DIAGNOSIS — J209 Acute bronchitis, unspecified: Secondary | ICD-10-CM | POA: Insufficient documentation

## 2016-10-15 DIAGNOSIS — R093 Abnormal sputum: Secondary | ICD-10-CM | POA: Insufficient documentation

## 2016-10-15 DIAGNOSIS — F17200 Nicotine dependence, unspecified, uncomplicated: Secondary | ICD-10-CM | POA: Insufficient documentation

## 2016-10-15 MED ORDER — AZITHROMYCIN 250 MG PO TABS: ORAL_TABLET | ORAL | 0 refills | Status: DC

## 2016-10-15 MED ORDER — PREDNISONE 50 MG PO TABS: ORAL_TABLET | ORAL | 0 refills | Status: DC

## 2016-10-15 MED ORDER — AMOXICILLIN 500 MG PO TABS: 500.0000 mg | ORAL_TABLET | Freq: Two times a day (BID) | ORAL | 0 refills | Status: AC

## 2016-10-15 NOTE — ED Triage Notes (Signed)
States cough started about 1 month ago  Cough has been occasionally prod

## 2016-10-15 NOTE — ED Provider Notes (Signed)
[facility] Emergency Department Provider Note  ____________________________________________  Time seen: Approximately 5:48 PM  I have reviewed the triage vital signs and the nursing notes.   HISTORY  Chief Complaint Cough   HPI Shawn Leblanc is a 31 y.o. male presenting to the emergency department with productive cough for purulent sputum production for approximately 1 month. Patient also states that he experiences occasional shortness of breath. Patient states the cough started with upper respiratory track infection symptoms. He denies chest pain, chest tightness, nausea, vomiting and abdominal pain. Patient secondarily reports wanting a skin lesion to be examined. He denies night sweats, fever, chills or weight loss. No personal history of malignancy. No alleviating measures have been attempted.   Past Medical History:  Diagnosis Date  . Anxiety   . Umbilical hernia     There are no active problems to display for this patient.   History reviewed. No pertinent surgical history.  Prior to Admission medications   Medication Sig Start Date End Date Taking? Authorizing Provider  amoxicillin (AMOXIL) 500 MG tablet Take 1 tablet (500 mg total) by mouth 2 (two) times daily. 10/15/16 10/25/16  Orvil Feil, PA-C  clonazePAM (KLONOPIN) 0.5 MG tablet Take 0.5 mg by mouth 2 (two) times daily as needed. For anxiety    [provider]  guaiFENesin-codeine (ROBITUSSIN AC) 100-10 MG/5ML syrup Take 10 mLs by mouth 4 (four) times daily as needed for cough. 01/18/16   Triplett, Rulon Eisenmenger B, FNP  HYDROcodone-acetaminophen (NORCO/VICODIN) 5-325 MG tablet Take 1 tablet by mouth every 6 (six) hours as needed for moderate pain. 01/12/16   Enid Derry, PA-C  ibuprofen (ADVIL,MOTRIN) 800 MG tablet Take 1 tablet (800 mg total) by mouth every 8 (eight) hours as needed. 02/20/16   Joni Reining, PA-C  naproxen (NAPROSYN) 500 MG tablet Take 1 tablet (500 mg total) by  mouth 2 (two) times daily with a meal. 01/18/16   Triplett, Cari B, FNP  predniSONE (DELTASONE) 50 MG tablet Take one tablet daily for the next five days. 10/15/16   Orvil Feil, PA-C    Allergies Patient has no known allergies.  No family history on file.  Social History Social History  Substance Use Topics  . Smoking status: Current Every Day Smoker    Packs/day: 0.50    Types: Cigarettes  . Smokeless tobacco: Never Used  . Alcohol use No     Review of Systems  Constitutional: No fever/chills Eyes: No visual changes. No discharge ENT: No upper respiratory complaints. Cardiovascular: no chest pain. Respiratory: Patient has productive cough  Gastrointestinal: No abdominal pain.  No nausea, no vomiting.  No diarrhea.  No constipation. Musculoskeletal: Negative for musculoskeletal pain. Skin: Patient has skin lesion.  Neurological: Negative for headaches, focal weakness or numbness.   ____________________________________________   PHYSICAL EXAM:  VITAL SIGNS: ED Triage Vitals [10/15/16 1722]  Enc Vitals Group     BP 126/74     Pulse Rate 86     Resp 18     Temp 98.4 F (36.9 C)     Temp Source Oral     SpO2 96 %     Weight 185 lb (83.9 kg)     Height  (1.905 m)     Head Circumference      Peak Flow      Pain Score      Pain Loc      Pain Edu?      Excl. in GC?  Constitutional: Alert and oriented. Well appearing and in no acute distress. Eyes: Conjunctivae are normal. PERRL. EOMI. Head: Atraumatic. ENT:      Ears: Left tympanic membranes is occluded by wax. Right tympanic membrane is pearly.      Nose: No congestion/rhinnorhea.      Mouth/Throat: Mucous membranes are moist.  Hematological/Lymphatic/Immunilogical: No cervical lymphadenopathy. Cardiovascular: Normal rate, regular rhythm. Normal S1 and S2.  Good peripheral circulation. Respiratory: Normal respiratory effort without tachypnea or retractions. Lungs CTAB. Good air entry to the  bases with no decreased or absent breath sounds. Musculoskeletal: Full range of motion to all extremities. No gross deformities appreciated. Neurologic:  Normal speech and language. No gross focal neurologic deficits are appreciated.  Skin: Patient has a 0.5 cm x 0.5 cm circumferential, round skin lesion that is flat and flush colored. No surrounding cellulitis or induration. Psychiatric: Mood and affect are normal. Speech and behavior are normal. Patient exhibits appropriate insight and judgement.   ____________________________________________   LABS (all labs ordered are listed, but only abnormal results are displayed)  Labs Reviewed - No data to display ____________________________________________  EKG   ____________________________________________  RADIOLOGY Geraldo Pitter, personally viewed and evaluated these images (plain radiographs) as part of my medical decision making, as well as reviewing the written report by the radiologist.     Dg Chest 2 View  Result Date: 10/15/2016 CLINICAL DATA:  Cough and congestion 1 month. EXAM: CHEST  2 VIEW COMPARISON:  01/18/2016 FINDINGS: Lungs are adequately inflated without consolidation or effusion. Cardiomediastinal silhouette is within normal. Remaining bones and soft tissues are otherwise unchanged. IMPRESSION: No acute cardiopulmonary disease. Electronically Signed   By: Elberta Fortis M.D.   On: 10/15/2016 18:21    ____________________________________________    PROCEDURES  Procedure(s) performed:    Procedures    Medications - No data to display   ____________________________________________   INITIAL IMPRESSION / ASSESSMENT AND PLAN / ED COURSE  Pertinent labs & imaging results that were available during my care of the patient were reviewed by me and considered in my medical decision making (see chart for details).  Review of the [place] CSRS was performed in accordance of the [facility] prior to dispensing any  controlled drugs.     Assessment and Plan:  Acute bronchitis Patient presents to the emergency department with productive cough for purulent sputum production for the past month with intermittent shortness of breath. DG chest reveals no consolidations or findings consistent with pneumonia. Patient was discharged with amoxicillin as patient states he cannot afford azithromycin. Patient was advised follow-up with primary care as needed. Patient was also advised to seek care with dermatology for follow-up of skin lesion. Vital signs are reassuring prior to discharge. All patient questions were answered.   ____________________________________________  FINAL CLINICAL IMPRESSION(S) / ED DIAGNOSES  Final diagnoses:  Acute bronchitis, unspecified organism      NEW MEDICATIONS STARTED DURING THIS VISIT:  Discharge Medication List as of 10/15/2016  6:30 PM    START taking these medications   Details  predniSONE (DELTASONE) 50 MG tablet Take one tablet daily for the next five days., Print    azithromycin (ZITHROMAX Z-PAK) 250 MG tablet Take two tablets the first day. Take one tablet by mouth on days 2-5., Print            This chart was dictated using voice recognition software/Dragon. Despite best efforts to proofread, errors can occur which can change the meaning. Any change was purely unintentional.  Pia Mau [place], PA-C 10/15/16 1859    Jeanmarie Plant, MD 10/15/16 [phone]

## 2017-10-24 ENCOUNTER — Emergency Department
Admission: EM | Admit: 2017-10-24 | Discharge: 2017-10-24 | Disposition: A | Discharge status: Home / Self Care | Payer: Self-pay | Attending: Emergency Medicine | Admitting: Emergency Medicine

## 2017-10-24 ENCOUNTER — Other Ambulatory Visit: Payer: Self-pay

## 2017-10-24 ENCOUNTER — Emergency Department: Payer: Self-pay

## 2017-10-24 DIAGNOSIS — S82302A Unspecified fracture of lower end of left tibia, initial encounter for closed fracture: Secondary | ICD-10-CM | POA: Insufficient documentation

## 2017-10-24 DIAGNOSIS — Y33XXXA Other specified events, undetermined intent, initial encounter: Secondary | ICD-10-CM | POA: Insufficient documentation

## 2017-10-24 DIAGNOSIS — Y939 Activity, unspecified: Secondary | ICD-10-CM | POA: Insufficient documentation

## 2017-10-24 DIAGNOSIS — Y929 Unspecified place or not applicable: Secondary | ICD-10-CM | POA: Insufficient documentation

## 2017-10-24 DIAGNOSIS — Y998 Other external cause status: Secondary | ICD-10-CM | POA: Insufficient documentation

## 2017-10-24 DIAGNOSIS — F17200 Nicotine dependence, unspecified, uncomplicated: Secondary | ICD-10-CM | POA: Insufficient documentation

## 2017-10-24 DIAGNOSIS — S93402A Sprain of unspecified ligament of left ankle, initial encounter: Secondary | ICD-10-CM | POA: Insufficient documentation

## 2017-10-24 DIAGNOSIS — T148XXA Other injury of unspecified body region, initial encounter: Secondary | ICD-10-CM

## 2017-10-24 MED ORDER — IBUPROFEN 600 MG PO TABS: 600.0000 mg | ORAL_TABLET | Freq: Four times a day (QID) | ORAL | 0 refills | Status: DC | PRN

## 2017-10-24 NOTE — ED Provider Notes (Signed)
[facility] Emergency Department Provider Note  ____________________________________________  Time seen: Approximately 9:37 AM  I have reviewed the triage vital signs and the nursing notes.   HISTORY  Chief Complaint Ankle Pain    HPI Shawn Leblanc is a 32 y.o. male that presents to the emergency department for evaluation of left ankle pain after injury this morning.  Patient states that he was tired this morning and rolled his ankle.  Ankle rolled inward.  He did not fall.  Swelling and pain has increased throughout the day.  He has been walking on it normally.  He has never injured this ankle before.  No additional injuries.   Past Medical History:  Diagnosis Date  . Anxiety   . Umbilical hernia     There are no active problems to display for this patient.   History reviewed. No pertinent surgical history.  Prior to Admission medications   Medication Sig Start Date End Date Taking? Authorizing Provider  clonazePAM (KLONOPIN) 0.5 MG tablet Take 0.5 mg by mouth 2 (two) times daily as needed. For anxiety    [provider]  guaiFENesin-codeine (ROBITUSSIN AC) 100-10 MG/5ML syrup Take 10 mLs by mouth 4 (four) times daily as needed for cough. 01/18/16   Triplett, Rulon Eisenmenger B, FNP  HYDROcodone-acetaminophen (NORCO/VICODIN) 5-325 MG tablet Take 1 tablet by mouth every 6 (six) hours as needed for moderate pain. 01/12/16   Enid Derry, PA-C  ibuprofen (ADVIL,MOTRIN) 600 MG tablet Take 1 tablet (600 mg total) by mouth every 6 (six) hours as needed. 10/24/17   Enid Derry, PA-C  naproxen (NAPROSYN) 500 MG tablet Take 1 tablet (500 mg total) by mouth 2 (two) times daily with a meal. 01/18/16   Triplett, Cari B, FNP  predniSONE (DELTASONE) 50 MG tablet Take one tablet daily for the next five days. 10/15/16   Orvil Feil, PA-C    Allergies Patient has no known allergies.  No family history on file.  Social History Social History   Tobacco  Use  . Smoking status: Current Every Day Smoker    Packs/day: 0.50    Types: Cigarettes  . Smokeless tobacco: Never Used  Substance Use Topics  . Alcohol use: No  . Drug use: No     Review of Systems  Gastrointestinal:No nausea, no vomiting.  Musculoskeletal: Positive for ankle pain. Skin: Negative for rash, abrasions, lacerations, ecchymosis.  Positive for swelling. Neurological: Negative for headaches, numbness or tingling   ____________________________________________   PHYSICAL EXAM:  VITAL SIGNS: ED Triage Vitals [10/24/17 0739]  Enc Vitals Group     BP (!) 146/72     Pulse Rate 78     Resp 18     Temp 98.1 F (36.7 C)     Temp Source Oral     SpO2 97 %     Weight 190 lb (86.2 kg)     Height 6\' 3"  (1.905 m)     Head Circumference      Peak Flow      Pain Score 6     Pain Loc      Pain Edu?      Excl. in GC?      Constitutional: Alert and oriented. Well appearing and in no acute distress. Eyes: Conjunctivae are normal. PERRL. EOMI. Head: Atraumatic. ENT:      Ears:      Nose: No congestion/rhinnorhea.      Mouth/Throat: Mucous membranes are moist.  Neck: No stridor.  \ Cardiovascular:  Normal rate, regular rhythm.  Good peripheral circulation. Respiratory: Normal respiratory effort without tachypnea or retractions. Lungs CTAB. Good air entry to the bases with no decreased or absent breath sounds. Musculoskeletal: Full range of motion to all extremities. No gross deformities appreciated.  Swelling and tenderness palpation over lateral malleolus.  Mild tenderness to palpation over anterior ankle. Neurologic:  Normal speech and language. No gross focal neurologic deficits are appreciated.  Skin:  Skin is warm, dry and intact. No rash noted. Psychiatric: Mood and affect are normal. Speech and behavior are normal. Patient exhibits appropriate insight and judgement.   ____________________________________________   LABS (all labs ordered are listed, but only  abnormal results are displayed)  Labs Reviewed - No data to display ____________________________________________  EKG   ____________________________________________  RADIOLOGY Lexine Baton, personally viewed and evaluated these images (plain radiographs) as part of my medical decision making, as well as reviewing the written report by the radiologist.  Dg Ankle Complete Left  Result Date: 10/24/2017 CLINICAL DATA:  Twisting injury of the ankle at home this morning with persistent pain and swelling laterally. EXAM: LEFT ANKLE COMPLETE - 3+ VIEW COMPARISON:  None. FINDINGS: The bones are subjectively adequately mineralized. The joint mortise is preserved. The talar dome is intact. There is no medial or lateral malleolar fracture. There is radiodensity along the anterior aspect of the tibiotalar joint which may reflect an avulsion though no donor site is clearly evident. The talus and calcaneus exhibit no acute abnormality. There is considerable soft tissue swelling laterally with a milder degree of swelling anteriorly. IMPRESSION: Possible avulsion from the anterior aspect of the distal tibia. No acute fracture elsewhere. Preservation of the ankle joint mortise. Electronically Signed   By: David  [place] M.D.   On: 10/24/2017 08:46    ____________________________________________    PROCEDURES  Procedure(s) performed:    Procedures    Medications - No data to display   ____________________________________________   INITIAL IMPRESSION / ASSESSMENT AND PLAN / ED COURSE  Pertinent labs & imaging results that were available during my care of the patient were reviewed by me and considered in my medical decision making (see chart for details).  Review of the [place] CSRS was performed in accordance of the [facility] prior to dispensing any controlled drugs.   Patient's diagnosis is consistent with ankle sprain and possible avulsion fracture.  Vital signs and exam are reassuring.  X-ray  consistent with possible avulsion fracture to anterior distal tibia.  Ankle splint was placed. Crutches were given.  Patient will be discharged home with prescriptions for ibuprofen. Patient is to follow up with orthopedics as directed. Patient is given ED precautions to return to the ED for any worsening or new symptoms.     ____________________________________________  FINAL CLINICAL IMPRESSION(S) / ED DIAGNOSES  Final diagnoses:  Avulsion fracture  Sprain of left ankle, unspecified ligament, initial encounter      NEW MEDICATIONS STARTED DURING THIS VISIT:  ED Discharge Orders         Ordered    ibuprofen (ADVIL,MOTRIN) 600 MG tablet  Every 6 hours PRN     10/24/17 1023              This chart was dictated using voice recognition software/Dragon. Despite best efforts to proofread, errors can occur which can change the meaning. Any change was purely unintentional.    Enid Derry, PA-C 10/24/17 1553    Jeanmarie Plant, MD 10/25/17 [phone]

## 2017-10-24 NOTE — ED Triage Notes (Signed)
Pt states he twisted his left ankle at home this morning and is having pain, states his employer needs a note .

## 2017-10-24 NOTE — ED Notes (Signed)
See triage note  States he rolled his ankle this am  Positive swelling no deformity noted good pulses  Is able to bear wt

## 2017-11-06 ENCOUNTER — Emergency Department
Admission: EM | Admit: 2017-11-06 | Discharge: 2017-11-06 | Disposition: A | Discharge status: Home / Self Care | Payer: Self-pay | Attending: Emergency Medicine | Admitting: Emergency Medicine

## 2017-11-06 ENCOUNTER — Encounter: Payer: Self-pay | Admitting: Emergency Medicine

## 2017-11-06 ENCOUNTER — Other Ambulatory Visit: Payer: Self-pay

## 2017-11-06 DIAGNOSIS — Z79899 Other long term (current) drug therapy: Secondary | ICD-10-CM | POA: Insufficient documentation

## 2017-11-06 DIAGNOSIS — F17210 Nicotine dependence, cigarettes, uncomplicated: Secondary | ICD-10-CM | POA: Insufficient documentation

## 2017-11-06 DIAGNOSIS — M26609 Unspecified temporomandibular joint disorder, unspecified side: Secondary | ICD-10-CM | POA: Insufficient documentation

## 2017-11-06 MED ORDER — PREDNISONE 10 MG PO TABS: ORAL_TABLET | ORAL | 0 refills | Status: DC

## 2017-11-06 NOTE — ED Notes (Signed)
Pt c/o pain to right jaw - pain present for 5 days - pain is worse opening mouth and eating - the pain radiates into right ear

## 2017-11-06 NOTE — ED Triage Notes (Signed)
TMJ pain for couple days. Pain with opening mouth and chewing food. VSS. No sx infection.  No recent puncture or other wounds.

## 2017-11-06 NOTE — ED Provider Notes (Signed)
[facility] Emergency Department Provider Note   ____________________________________________   First MD Initiated Contact with Patient 11/06/17 1232     (approximate)  I have reviewed the triage vital signs and the nursing notes.   HISTORY  Chief Complaint Temporomandibular Joint Pain   HPI Shawn Leblanc is a 32 y.o. male resents to the ED with complaint of right jaw pain just in front of his right ear.  Patient states is worse when he is opening closing his mouth and when he is eating.  He denies any injury to his face or jaw.  Patient has taken ibuprofen a couple of times with out any relief.  He denies any ear discomfort or current problems with his teeth.  He rates his pain as a 2 out of 10.  Past Medical History:  Diagnosis Date  . Anxiety   . Umbilical hernia     There are no active problems to display for this patient.   History reviewed. No pertinent surgical history.  Prior to Admission medications   Medication Sig Start Date End Date Taking? Authorizing Provider  clonazePAM (KLONOPIN) 0.5 MG tablet Take 0.5 mg by mouth 2 (two) times daily as needed. For anxiety    [provider]  predniSONE (DELTASONE) 10 MG tablet Take 3 tablets once a day for 5 days 11/06/17   Tommi Rumps, PA-C    Allergies Patient has no known allergies.  History reviewed. No pertinent family history.  Social History Social History   Tobacco Use  . Smoking status: Current Every Day Smoker    Packs/day: 0.50    Types: Cigarettes  . Smokeless tobacco: Never Used  Substance Use Topics  . Alcohol use: No  . Drug use: No    Review of Systems Constitutional: No fever/chills Eyes: No visual changes. ENT: Pain right TMJ area. Cardiovascular: Denies chest pain. Respiratory: Denies shortness of breath. Musculoskeletal: Negative for muscle aches. Skin: Negative for rash. Neurological: Negative for headaches, focal weakness or  numbness. ____________________________________________   PHYSICAL EXAM:  VITAL SIGNS: ED Triage Vitals [11/06/17 1218]  Enc Vitals Group     BP 129/74     Pulse Rate 74     Resp 16     Temp 98.5 F (36.9 C)     Temp Source Oral     SpO2 99 %     Weight 189 lb 15.9 oz (86.2 kg)     Height 6\' 3"  (1.905 m)     Head Circumference      Peak Flow      Pain Score 2     Pain Loc      Pain Edu?      Excl. in [place]?    Constitutional: Alert and oriented. Well appearing and in no acute distress. Eyes: Conjunctivae are normal. PERRL. EOMI. Head: Atraumatic. Nose: No trauma.  EACs and TMs are clear bilaterally. Mouth/Throat: Mucous membranes are moist.  Oropharynx non-erythematous.  There are 2 decaying teeth on the right lower molars posteriorly.  No edema of the gums and no drainage is noted.  Nontender to palpation with a tongue depressor.  There is tenderness on palpation of the TMJ area.  No edema or discoloration noted.  No popping is noted on exam. Neck: No stridor.   Hematological/Lymphatic/Immunilogical: No cervical lymphadenopathy. Cardiovascular: Normal rate, regular rhythm. Grossly normal heart sounds.  Good peripheral circulation. Respiratory: Normal respiratory effort.  No retractions. Lungs CTAB. Musculoskeletal: Moves upper and lower extremities with  any difficulty and normal gait was noted. Neurologic:  Normal speech and language. No gross focal neurologic deficits are appreciated. No gait instability. Skin:  Skin is warm, dry and intact. No rash noted. Psychiatric: Mood and affect are normal. Speech and behavior are normal.  ____________________________________________   LABS (all labs ordered are listed, but only abnormal results are displayed)  Labs Reviewed - No data to display   PROCEDURES  Procedure(s) performed: None  Procedures  Critical Care performed: No  ____________________________________________   INITIAL IMPRESSION / ASSESSMENT AND PLAN / ED  COURSE  As part of my medical decision making, I reviewed the following data within the electronic MEDICAL RECORD NUMBER Notes from prior ED visits and [place] Controlled Substance Database  She presents with 5 days of TMJ pain on the right side.  He denies any known injury.  He has been taking ibuprofen infrequently without any relief.  Patient states the pain is worse with eating.  Exam is benign for any acute injury.  History and physical exam is consistent with TMJ.  Patient was placed on prednisone.  He is to follow-up with [facility] acute care if any continued problems.  ____________________________________________   FINAL CLINICAL IMPRESSION(S) / ED DIAGNOSES  Final diagnoses:  TMJ (temporomandibular joint disorder)     ED Discharge Orders         Ordered    predniSONE (DELTASONE) 10 MG tablet     11/06/17 1247           Note:  This document was prepared using Dragon voice recognition software and may include unintentional dictation errors.    Tommi Rumps, PA-C 11/06/17 1531    Governor Rooks, MD 11/07/17 1001

## 2017-11-06 NOTE — Discharge Instructions (Signed)
Follow-up with [facility] acute care or your primary care if any continued problems.  Discontinue taking ibuprofen.  Begin taking prednisone as directed.  You may also take Tylenol with this medication if extra pain medication as needed.  Avoid foods that require a lot of chewing at this time until your jaw has resolved.  Soft foods and liquids.

## 2019-07-09 ENCOUNTER — Other Ambulatory Visit: Payer: Self-pay

## 2019-07-09 ENCOUNTER — Emergency Department
Admission: EM | Admit: 2019-07-09 | Discharge: 2019-07-09 | Disposition: A | Discharge status: Home / Self Care | Payer: Self-pay | Attending: Emergency Medicine | Admitting: Emergency Medicine

## 2019-07-09 ENCOUNTER — Encounter: Payer: Self-pay | Admitting: Emergency Medicine

## 2019-07-09 DIAGNOSIS — K0500 Acute gingivitis, plaque induced: Secondary | ICD-10-CM | POA: Insufficient documentation

## 2019-07-09 DIAGNOSIS — K068 Other specified disorders of gingiva and edentulous alveolar ridge: Secondary | ICD-10-CM | POA: Insufficient documentation

## 2019-07-09 DIAGNOSIS — F17210 Nicotine dependence, cigarettes, uncomplicated: Secondary | ICD-10-CM | POA: Insufficient documentation

## 2019-07-09 MED ORDER — TRIAMCINOLONE ACETONIDE 0.1 % MT PSTE
PASTE | Freq: Once | OROMUCOSAL | Status: AC
  Filled 2019-07-09: qty 5

## 2019-07-09 MED ORDER — CHLORHEXIDINE GLUCONATE 0.12 % MT SOLN: 15.0000 mL | Freq: Two times a day (BID) | OROMUCOSAL | 0 refills | Status: DC

## 2019-07-09 NOTE — Discharge Instructions (Signed)
Follow up with ENT and the dentist.  Return to the ER for symptoms that change or worsen if unable to schedule an appointment.

## 2019-07-09 NOTE — ED Triage Notes (Signed)
Dental pain for past 6 days. Pt talks in compelte sentences denies any fever or any other symptom at prestent. No respiratory distress noted

## 2019-07-10 NOTE — ED Provider Notes (Signed)
[facility] Emergency Department Provider Note ____________________________________________  Time seen: Approximately 4:03 PM  I have reviewed the triage vital signs and the nursing notes.   HISTORY  Chief Complaint Dental Pain   HPI Shawn Leblanc is a 34 y.o. male presenting to the emergency department for treatment and evaluation of a sore in his mouth and "puffiness" around his gumline.  He states that he smokes cigarettes and is worried that he has mouth cancer.  Symptoms started several days ago.  No alleviating measures attempted prior to arrival.   Past Medical History:  Diagnosis Date  . Anxiety   . Umbilical hernia     There are no problems to display for this patient.   History reviewed. No pertinent surgical history.  Prior to Admission medications   Medication Sig Start Date End Date Taking? Authorizing Provider  chlorhexidine (PERIDEX) 0.12 % solution Use as directed 15 mLs in the mouth or throat 2 (two) times daily. 07/09/19   Joann Kulpa, Johnette Abraham B, FNP  clonazePAM (KLONOPIN) 0.5 MG tablet Take 0.5 mg by mouth 2 (two) times daily as needed. For anxiety    [provider]  predniSONE (DELTASONE) 10 MG tablet Take 3 tablets once a day for 5 days 11/06/17   Johnn Hai, PA-C    Allergies Patient has no known allergies.  No family history on file.  Social History Social History   Tobacco Use  . Smoking status: Current Every Day Smoker    Packs/day: 0.50    Types: Cigarettes  . Smokeless tobacco: Never Used  Substance Use Topics  . Alcohol use: No  . Drug use: No    Review of Systems Constitutional: Negative for fever or recent illness. ENT: Positive for dental pain. Musculoskeletal: Negative for trismus of the jaw.  Skin: Negative for wound or lesion. ____________________________________________   PHYSICAL EXAM:  VITAL SIGNS: ED Triage Vitals  Enc Vitals Group     BP 07/09/19 1931 (!) 143/76     Pulse  Rate 07/09/19 1931 82     Resp 07/09/19 1931 20     Temp 07/09/19 1931 98.8 F (37.1 C)     Temp Source 07/09/19 1931 Oral     SpO2 07/09/19 1931 98 %     Weight 07/09/19 1932 195 lb (88.5 kg)     Height 07/09/19 1932 6\' 2"  (1.88 m)     Head Circumference --      Peak Flow --      Pain Score 07/09/19 1932 5     Pain Loc --      Pain Edu? --      Excl. in [place]? --     Constitutional: Alert and oriented. Well appearing and in no acute distress. Eyes: Conjunctiva are clear without discharge or drainage. Mouth/Throat: Buccal service just behind back molar with small erosion that matches pattern of where teeth come together. Gingival edema on left lower without abscess. Periodontal Exam Hematological/Lymphatic/Immunilogical: No palpable adenopathy. Respiratory: Respirations even and unlabored. Musculoskeletal: Full ROM of the jaw. Neurologic: Awake, alert, oriented.  Skin:  No rash on exposed skin. Psychiatric: Affect and behavior intact.  ____________________________________________   LABS (all labs ordered are listed, but only abnormal results are displayed)  Labs Reviewed - No data to display ____________________________________________   RADIOLOGY  Not indicated. ____________________________________________   PROCEDURES  Procedure(s) performed:   Procedures  Critical Care performed: No ____________________________________________   INITIAL IMPRESSION / ASSESSMENT AND PLAN / ED COURSE  Chrissie Noa  [name] is a 34 y.o. male presenting to the emergency department for treatment and evaluation of mouth pain.  See HPI for further details.  The lesion that he is concerned about appears as if he has bitten the inside of his jaw.  The pattern of the lesion fits where the 2 back molars would meet.  He will be given some Peridex mouth rinse and triamcinolone dental paste.  He was advised that he should follow-up with a dentist for gingivitis and ENT if the erosion in his mouth  continues to get worse or he is concerned about mouth cancer.  Pertinent labs & imaging results that were available during my care of the patient were reviewed by me and considered in my medical decision making (see chart for details).  ____________________________________________   FINAL CLINICAL IMPRESSION(S) / ED DIAGNOSES  Final diagnoses:  Gum lesion  Gingivitis, acute    Discharge Medication List as of 07/09/2019  8:40 PM    START taking these medications   Details  chlorhexidine (PERIDEX) 0.12 % solution Use as directed 15 mLs in the mouth or throat 2 (two) times daily., Starting Mon 07/09/2019, Normal        If controlled substance prescribed during this visit, 12 month history viewed on the [facility] prior to issuing an initial prescription for Schedule II or III opiod.  Note:  This document was prepared using Dragon voice recognition software and may include unintentional dictation errors.   Chinita Pester, FNP 07/10/19 1611    Sharyn Creamer, MD 07/11/19 1234

## 2020-01-02 ENCOUNTER — Emergency Department: Payer: Self-pay

## 2020-01-02 ENCOUNTER — Other Ambulatory Visit: Payer: Self-pay

## 2020-01-02 ENCOUNTER — Encounter: Payer: Self-pay | Admitting: Intensive Care

## 2020-01-02 ENCOUNTER — Emergency Department
Admission: EM | Admit: 2020-01-02 | Discharge: 2020-01-02 | Disposition: A | Discharge status: Home / Self Care | Payer: Self-pay | Attending: Emergency Medicine | Admitting: Emergency Medicine

## 2020-01-02 DIAGNOSIS — J029 Acute pharyngitis, unspecified: Secondary | ICD-10-CM

## 2020-01-02 DIAGNOSIS — Z20822 Contact with and (suspected) exposure to covid-19: Secondary | ICD-10-CM | POA: Insufficient documentation

## 2020-01-02 DIAGNOSIS — F17210 Nicotine dependence, cigarettes, uncomplicated: Secondary | ICD-10-CM | POA: Insufficient documentation

## 2020-01-02 DIAGNOSIS — B349 Viral infection, unspecified: Secondary | ICD-10-CM | POA: Insufficient documentation

## 2020-01-02 DIAGNOSIS — R0989 Other specified symptoms and signs involving the circulatory and respiratory systems: Secondary | ICD-10-CM | POA: Insufficient documentation

## 2020-01-02 LAB — RESP PANEL BY RT-PCR (FLU A&B, COVID) ARPGX2
Influenza A by PCR: NEGATIVE
Influenza B by PCR: NEGATIVE
SARS Coronavirus 2 by RT PCR: NEGATIVE

## 2020-01-02 LAB — GROUP A STREP BY PCR: Group A Strep by PCR: NOT DETECTED

## 2020-01-02 MED ORDER — LIDOCAINE VISCOUS HCL 2 % MT SOLN: 5.0000 mL | Freq: Four times a day (QID) | OROMUCOSAL | 0 refills | Status: DC | PRN

## 2020-01-02 MED ORDER — FEXOFENADINE-PSEUDOEPHED ER 60-120 MG PO TB12: 1.0000 | ORAL_TABLET | Freq: Two times a day (BID) | ORAL | 0 refills | Status: DC

## 2020-01-02 MED ORDER — IBUPROFEN 600 MG PO TABS: 600.0000 mg | ORAL_TABLET | Freq: Three times a day (TID) | ORAL | 0 refills | Status: AC | PRN

## 2020-01-02 NOTE — ED Provider Notes (Signed)
[facility] Emergency Department Provider Note   ____________________________________________   First MD Initiated Contact with Patient 01/02/20 [phone]     (approximate)  I have reviewed the triage vital signs and the nursing notes.   HISTORY  Chief Complaint Headache and Sore Throat    HPI Shawn Leblanc is a 34 y.o. male patient presents with sore throat, headache, nasal and chest congestion.  Patient has been vaccinated COVID-19 in May 2021.  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  Patient state complaint started 3 days ago but is worse in the past 24 hours.  Patient has not taken the flu shot for the season.  Rates his pain discomfort as a 5/10.  Describes his pain as "sore".  No palliative measure for complaint.         Past Medical History:  Diagnosis Date  . Anxiety   . Umbilical hernia     There are no problems to display for this patient.   History reviewed. No pertinent surgical history.  Prior to Admission medications   Medication Sig Start Date End Date Taking? Authorizing Provider  chlorhexidine (PERIDEX) 0.12 % solution Use as directed 15 mLs in the mouth or throat 2 (two) times daily. 07/09/19   Triplett, Rulon Eisenmenger B, FNP  clonazePAM (KLONOPIN) 0.5 MG tablet Take 0.5 mg by mouth 2 (two) times daily as needed. For anxiety    [provider]  fexofenadine-pseudoephedrine (ALLEGRA-D) 60-120 MG 12 hr tablet Take 1 tablet by mouth 2 (two) times daily. 01/02/20   Joni Reining, PA-C  ibuprofen (ADVIL) 600 MG tablet Take 1 tablet (600 mg total) by mouth every 8 (eight) hours as needed. 01/02/20   Joni Reining, PA-C  lidocaine (XYLOCAINE) 2 % solution Use as directed 5 mLs in the mouth or throat every 6 (six) hours as needed for mouth pain. Swish and swallow 01/02/20   Joni Reining, PA-C  predniSONE (DELTASONE) 10 MG tablet Take 3 tablets once a day for 5 days 11/06/17   Tommi Rumps, PA-C     Allergies Patient has no known allergies.  History reviewed. No pertinent family history.  Social History Social History   Tobacco Use  . Smoking status: Current Every Day Smoker    Packs/day: 0.50    Types: Cigarettes  . Smokeless tobacco: Never Used  Substance Use Topics  . Alcohol use: No  . Drug use: No    Review of Systems Constitutional: No fever/chills Eyes: No visual changes. ENT sore throat.  Nasal congestion. Cardiovascular: Denies chest pain. Respiratory: Denies shortness of breath. Gastrointestinal: No abdominal pain.  No nausea, no vomiting.  No diarrhea.  No constipation. Genitourinary: Negative for dysuria. Musculoskeletal: Negative for back pain. Skin: Negative for rash. Neurological: Positive for headaches, but denies focal weakness or numbness. Psychiatric:  Anxiety   ____________________________________________   PHYSICAL EXAM:  VITAL SIGNS: ED Triage Vitals  Enc Vitals Group     BP 01/02/20 0842 (!) 142/68     Pulse Rate 01/02/20 0842 84     Resp 01/02/20 0842 18     Temp 01/02/20 0842 98.6 F (37 C)     Temp Source 01/02/20 0842 Oral     SpO2 01/02/20 0842 99 %     Weight 01/02/20 0842 195 lb (88.5 kg)     Height 01/02/20 0842 6\' 2"  (1.88 m)     Head Circumference --      Peak Flow --  Pain Score 01/02/20 0847 5     Pain Loc --      Pain Edu? --      Excl. in [place]? --     Constitutional: Alert and oriented. Well appearing and in no acute distress. Eyes: Conjunctivae are normal. PERRL. EOMI. Head: Atraumatic. Nose: Edematous nasal turbinates with clear rhinorrhea.   Mouth/Throat: Mucous membranes are moist.  Oropharynx -erythematous. Neck: No stridor.  Hematological/Lymphatic/Immunilogical: No cervical lymphadenopathy. Cardiovascular: Normal rate, regular rhythm. Grossly normal heart sounds.  Good peripheral circulation. Respiratory: Normal respiratory effort.  No retractions. Lungs CTAB. Gastrointestinal: Soft and  nontender. No distention. No abdominal bruits. No CVA tenderness. Genitourinary: Deferred Skin:  Skin is warm, dry and intact. No rash noted. Psychiatric: Mood and affect are normal. Speech and behavior are normal.  ____________________________________________   LABS (all labs ordered are listed, but only abnormal results are displayed)  Labs Reviewed  RESP PANEL BY RT-PCR (FLU A&B, COVID) ARPGX2  GROUP A STREP BY PCR   ____________________________________________  EKG   ____________________________________________  RADIOLOGY I, Joni Reining, personally viewed and evaluated these images (plain radiographs) as part of my medical decision making, as well as reviewing the written report by the radiologist.  ED MD interpretation: No acute findings on chest x-ray.  Official radiology report(s): DG Chest Portable 1 View  Result Date: 01/02/2020 CLINICAL DATA:  Chest congestion. Additional history provided: Patient reports sore throat, headache, congestion, chest discomfort when taking a breath since Saturday 12/29/2019. Symptoms worsening. Exposed to COVID at work. EXAM: PORTABLE CHEST 1 VIEW COMPARISON:  Prior chest radiographs 10/15/2016 and earlier. FINDINGS: Heart size within normal limits. No appreciable airspace consolidation or pulmonary edema. No evidence of pleural effusion or pneumothorax. No acute bony abnormality identified. IMPRESSION: No evidence of active cardiopulmonary disease. Electronically Signed   By: Jackey Loge DO   On: 01/02/2020 09:54    ____________________________________________   PROCEDURES  Procedure(s) performed (including Critical Care):  Procedures   ____________________________________________   INITIAL IMPRESSION / ASSESSMENT AND PLAN / ED COURSE  As part of my medical decision making, I reviewed the following data within the electronic MEDICAL RECORD NUMBER         Patient presents with 4 days of sore throat, headache, and chest  congestion.  Discussed negative COVID-19, negative strep, and negative flu results with patient.  Chest x-ray is grossly unremarkable.  Patient complaint physical exam consistent with viral illness.  Patient given discharge care instruction work note and advised take medication as directed.  Patient [facility]      ____________________________________________   FINAL CLINICAL IMPRESSION(S) / ED DIAGNOSES  Final diagnoses:  Viral illness  Sore throat     ED Discharge Orders         Ordered    fexofenadine-pseudoephedrine (ALLEGRA-D) 60-120 MG 12 hr tablet  2 times daily        01/02/20 1140    ibuprofen (ADVIL) 600 MG tablet  Every 8 hours PRN        01/02/20 1140    lidocaine (XYLOCAINE) 2 % solution  Every 6 hours PRN        01/02/20 1140          *Please note:  Shawn Leblanc was evaluated in Emergency Department on 01/02/2020 for the symptoms described in the history of present illness. He was evaluated in the context of the global COVID-19 pandemic, which necessitated consideration that the patient might be at risk for  infection with the SARS-CoV-2 virus that causes COVID-19. Institutional protocols and algorithms that pertain to the evaluation of patients at risk for COVID-19 are in a state of rapid change based on information released by regulatory bodies including the CDC and federal and state organizations. These policies and algorithms were followed during the patient's care in the ED.  Some ED evaluations and interventions may be delayed as a result of limited staffing during and the pandemic.*   Note:  This document was prepared using Dragon voice recognition software and may include unintentional dictation errors.    Joni Reining, PA-C 01/02/20 1144    Merwyn Katos, MD 01/02/20 1530

## 2020-01-02 NOTE — ED Triage Notes (Signed)
Pt c/o sore throat, headache, congestion, chest discomfort when taking a breath since Saturday 12/29/2019. Patient reports symptoms seem to be getting worse. Exposed to covid + people at work. Vaccinated for covid back in May 2021.

## 2020-01-02 NOTE — Discharge Instructions (Addendum)
Your test was negative for COVID-19, influenza, and strep pharyngitis.  Your chest x-ray was unremarkable.  Follow discharge care instruction take medication as directed

## 2020-01-03 ENCOUNTER — Telehealth: Payer: Self-pay | Admitting: Gerontology

## 2020-01-03 NOTE — Telephone Encounter (Signed)
LVM regarding ER referral @3  pm on 01/03/20-KW

## 2020-04-24 ENCOUNTER — Emergency Department
Admission: EM | Admit: 2020-04-24 | Discharge: 2020-04-24 | Disposition: A | Discharge status: Home / Self Care | Payer: Self-pay | Attending: Emergency Medicine | Admitting: Emergency Medicine

## 2020-04-24 ENCOUNTER — Other Ambulatory Visit: Payer: Self-pay

## 2020-04-24 ENCOUNTER — Encounter: Payer: Self-pay | Admitting: Emergency Medicine

## 2020-04-24 DIAGNOSIS — F17210 Nicotine dependence, cigarettes, uncomplicated: Secondary | ICD-10-CM | POA: Insufficient documentation

## 2020-04-24 DIAGNOSIS — K0889 Other specified disorders of teeth and supporting structures: Secondary | ICD-10-CM | POA: Insufficient documentation

## 2020-04-24 DIAGNOSIS — J029 Acute pharyngitis, unspecified: Secondary | ICD-10-CM | POA: Insufficient documentation

## 2020-04-24 LAB — GROUP A STREP BY PCR: Group A Strep by PCR: NOT DETECTED

## 2020-04-24 NOTE — Discharge Instructions (Addendum)
You can gargle with warm salt water if you like for your symptoms.  You may also take an anti-inflammatory.  Please return to clinic or the emergency department with any worsening symptoms including fever, difficulty swallowing or difficulty breathing to obtain repeat evaluation.

## 2020-04-24 NOTE — ED Notes (Signed)
See triage note  Presents with sore throat which started yesterday  No fever ,cough or body aches  States he noticed that his uvula was red last pm

## 2020-04-24 NOTE — ED Triage Notes (Signed)
Pt comes into the ED via POV c/o sore throat and mouth pain.  Pt states he feels like his "uvula is swollen".  Pt has clear speech and in NAD with even and unlabored respirations.

## 2020-04-24 NOTE — ED Provider Notes (Signed)
[facility] Emergency Department Provider Note  ____________________________________________   Event Date/Time   First MD Initiated Contact with Patient 04/24/20 [phone]     (approximate)  I have reviewed the triage vital signs and the nursing notes.   HISTORY  Chief Complaint Sore Throat and Oral Pain  HPI Shawn Leblanc is a 35 y.o. male who presents to the emergency department for evaluation of sore throat that started yesterday.  Patient denies any associated symptoms of fever, nasal congestion, cough, body aches or other.  He states he feels like his uvula is swollen.  He reports pain with swallowing, but has been able to swallow without significant difficulty.  No difficulty in breathing.  Denies recent travel or sick contacts.        Past Medical History:  Diagnosis Date  . Anxiety   . Umbilical hernia     There are no problems to display for this patient.   History reviewed. No pertinent surgical history.  Prior to Admission medications   Medication Sig Start Date End Date Taking? Authorizing Provider  clonazePAM (KLONOPIN) 0.5 MG tablet Take 0.5 mg by mouth 2 (two) times daily as needed. For anxiety    [provider]  ibuprofen (ADVIL) 600 MG tablet Take 1 tablet (600 mg total) by mouth every 8 (eight) hours as needed. 01/02/20   Joni Reining, PA-C    Allergies Patient has no known allergies.  History reviewed. No pertinent family history.  Social History Social History   Tobacco Use  . Smoking status: Current Every Day Smoker    Packs/day: 0.50    Types: Cigarettes  . Smokeless tobacco: Never Used  Substance Use Topics  . Alcohol use: No  . Drug use: No    Review of Systems Constitutional: No fever/chills Eyes: No visual changes. ENT: + sore throat. Cardiovascular: Denies chest pain. Respiratory: Denies shortness of breath. Gastrointestinal: No abdominal pain.  No nausea, no vomiting.  No diarrhea.  No  constipation. Genitourinary: Negative for dysuria. Musculoskeletal: Negative for back pain. Skin: Negative for rash. Neurological: Negative for headaches, focal weakness or numbness.   ____________________________________________   PHYSICAL EXAM:  VITAL SIGNS: ED Triage Vitals  Enc Vitals Group     BP 04/24/20 0953 134/68     Pulse Rate 04/24/20 0953 82     Resp 04/24/20 0953 17     Temp 04/24/20 0953 98 F (36.7 C)     Temp Source 04/24/20 0953 Oral     SpO2 04/24/20 0953 100 %     Weight 04/24/20 0929 185 lb (83.9 kg)     Height 04/24/20 0929 6\' 3"  (1.905 m)     Head Circumference --      Peak Flow --      Pain Score 04/24/20 0929 4     Pain Loc --      Pain Edu? --      Excl. in GC? --    Constitutional: Alert and oriented. Well appearing and in no acute distress. Eyes: Conjunctivae are normal. PERRL. EOMI. Head: Atraumatic. Nose: No congestion/rhinnorhea. Mouth/Throat: Mucous membranes are moist.  There is beefy erythematous appearance to the oropharynx with mild tonsillar enlargement and exudates bilaterally.  Uvula also appears mildly swollen and erythematous. Neck: No stridor.   Lymphatic: No cervical lymphadenopathy Cardiovascular: Normal rate, regular rhythm. Grossly normal heart sounds.  Good peripheral circulation. Respiratory: Normal respiratory effort.  No retractions. Lungs CTAB. Gastrointestinal: Soft and nontender. No distention. No abdominal  bruits. No CVA tenderness. Musculoskeletal: No lower extremity tenderness nor edema.  No joint effusions. Neurologic:  Normal speech and language. No gross focal neurologic deficits are appreciated. No gait instability. Skin:  Skin is warm, dry and intact. No rash noted. Psychiatric: Mood and affect are normal. Speech and behavior are normal.  ____________________________________________   LABS (all labs ordered are listed, but only abnormal results are displayed)  Labs Reviewed  GROUP A STREP BY PCR    ____________________________________________   INITIAL IMPRESSION / ASSESSMENT AND PLAN / ED COURSE  As part of my medical decision making, I reviewed the following data within the electronic MEDICAL RECORD NUMBER Nursing notes reviewed and incorporated and Notes from prior ED visits        patient is a 35 year old male who presents to the emergency department for evaluation of sore throat that began last night.  He denies any other associated symptoms.  In triage, the patient has normal vital signs.  On physical exam, he does have an oropharynx erythema with mildly enlarged tonsils and exudates.  He is managing secretions well on his own.  Discussed with the patient strep testing as well as Covid/flu testing.  He declines Covid/flu testing, stating that he recently had Covid and does not believe that he has it again.  Strep testing was performed and is negative.  At this time, patient does not meet [facility] criteria for treatment presumptively given absence of fever, lymphadenopathy.  At this time, discussed a period of watchful waiting.  Did encourage anti-inflammatories as well as viscous lidocaine for treatment, however the patient states that he does not want to take medications for this.  Patient is stable this time for outpatient therapy.  Return precautions were discussed at length.  Patient is stable at this time for outpatient follow-up.      ____________________________________________   FINAL CLINICAL IMPRESSION(S) / ED DIAGNOSES  Final diagnoses:  Exudative pharyngitis     ED Discharge Orders    None      *Please note:  Shawn Leblanc was evaluated in Emergency Department on 04/24/2020 for the symptoms described in the history of present illness. He was evaluated in the context of the global COVID-19 pandemic, which necessitated consideration that the patient might be at risk for infection with the SARS-CoV-2 virus that causes COVID-19. Institutional protocols and  algorithms that pertain to the evaluation of patients at risk for COVID-19 are in a state of rapid change based on information released by regulatory bodies including the CDC and federal and state organizations. These policies and algorithms were followed during the patient's care in the ED.  Some ED evaluations and interventions may be delayed as a result of limited staffing during and the pandemic.*   Note:  This document was prepared using Dragon voice recognition software and may include unintentional dictation errors.   Lucy Chris, [place] 04/24/20 5400    Phineas Semen, MD 05/02/20 [phone]

## 2020-08-04 ENCOUNTER — Emergency Department
Admission: EM | Admit: 2020-08-04 | Discharge: 2020-08-04 | Disposition: A | Discharge status: Home / Self Care | Payer: Self-pay | Attending: Emergency Medicine | Admitting: Emergency Medicine

## 2020-08-04 ENCOUNTER — Encounter: Payer: Self-pay | Admitting: Emergency Medicine

## 2020-08-04 ENCOUNTER — Other Ambulatory Visit: Payer: Self-pay

## 2020-08-04 ENCOUNTER — Emergency Department: Payer: Self-pay

## 2020-08-04 DIAGNOSIS — W228XXA Striking against or struck by other objects, initial encounter: Secondary | ICD-10-CM | POA: Insufficient documentation

## 2020-08-04 DIAGNOSIS — S0101XA Laceration without foreign body of scalp, initial encounter: Secondary | ICD-10-CM | POA: Insufficient documentation

## 2020-08-04 DIAGNOSIS — S060X0A Concussion without loss of consciousness, initial encounter: Secondary | ICD-10-CM | POA: Insufficient documentation

## 2020-08-04 DIAGNOSIS — Z23 Encounter for immunization: Secondary | ICD-10-CM | POA: Insufficient documentation

## 2020-08-04 DIAGNOSIS — F17210 Nicotine dependence, cigarettes, uncomplicated: Secondary | ICD-10-CM | POA: Insufficient documentation

## 2020-08-04 MED ORDER — TETANUS-DIPHTH-ACELL PERTUSSIS 5-2.5-18.5 LF-MCG/0.5 IM SUSY
0.5000 mL | PREFILLED_SYRINGE | Freq: Once | INTRAMUSCULAR | Status: AC
  Administered 2020-08-04: 0.5 mL via INTRAMUSCULAR
  Filled 2020-08-04: qty 0.5
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
## 2020-08-04 NOTE — ED Triage Notes (Signed)
While tightening a bolt on a car.  Hit on head with ratchet.  Laceration to top of head.  Bleeding controlled.

## 2020-12-15 ENCOUNTER — Other Ambulatory Visit: Payer: Self-pay

## 2020-12-15 ENCOUNTER — Emergency Department
Admission: EM | Admit: 2020-12-15 | Discharge: 2020-12-15 | Disposition: A | Discharge status: Home / Self Care | Payer: Self-pay | Attending: Emergency Medicine | Admitting: Emergency Medicine

## 2020-12-15 DIAGNOSIS — K047 Periapical abscess without sinus: Secondary | ICD-10-CM | POA: Insufficient documentation

## 2020-12-15 DIAGNOSIS — F17210 Nicotine dependence, cigarettes, uncomplicated: Secondary | ICD-10-CM | POA: Insufficient documentation

## 2020-12-15 DIAGNOSIS — K0510 Chronic gingivitis, plaque induced: Secondary | ICD-10-CM | POA: Insufficient documentation

## 2020-12-15 MED ORDER — AMOXICILLIN 500 MG PO TABS: 500.0000 mg | ORAL_TABLET | Freq: Two times a day (BID) | ORAL | 0 refills | Status: AC

## 2020-12-15 NOTE — ED Triage Notes (Signed)
Pt comes with c/o left lower dental pain for two days. Pt states bad tooth.

## 2020-12-15 NOTE — ED Provider Notes (Signed)
[facility] Emergency Department Provider Note  ____________________________________________  Time seen: Approximately 1:43 PM  I have reviewed the triage vital signs and the nursing notes.   HISTORY  Chief Complaint Dental Pain    HPI Shawn Leblanc is a 35 y.o. male with a history of anxiety, umbilical hernia who comes ED complaining of left lower dental pain for the past 2 days, gradual onset.  No difficulty breathing or swelling, no fever.  Has some chronic decayed teeth.  Anticipates having dental insurance on January 1 and plans to go see dentistry then to get these things taken care of.    Past Medical History:  Diagnosis Date   Anxiety    Umbilical hernia      There are no problems to display for this patient.    History reviewed. No pertinent surgical history.   Prior to Admission medications   Medication Sig Start Date End Date Taking? Authorizing Provider  amoxicillin (AMOXIL) 500 MG tablet Take 1 tablet (500 mg total) by mouth 2 (two) times daily for 10 days. 12/15/20 12/25/20 Yes Sharman Cheek, MD  clonazePAM (KLONOPIN) 0.5 MG tablet Take 0.5 mg by mouth 2 (two) times daily as needed. For anxiety    [provider]  ibuprofen (ADVIL) 600 MG tablet Take 1 tablet (600 mg total) by mouth every 8 (eight) hours as needed. 01/02/20   Joni Reining, PA-C     Allergies Patient has no known allergies.   No family history on file.  Social History Social History   Tobacco Use   Smoking status: Every Day    Packs/day: 0.50    Types: Cigarettes   Smokeless tobacco: Never  Substance Use Topics   Alcohol use: No   Drug use: No    Review of Systems  Constitutional:   No fever or chills.  ENT:   No sore throat. No rhinorrhea.  Positive dental pain Cardiovascular:   No chest pain or syncope. Respiratory:   No dyspnea or cough. Gastrointestinal:   Negative for abdominal pain, vomiting and diarrhea.   Musculoskeletal:   Negative for focal pain or swelling All other systems reviewed and are negative except as documented above in ROS and HPI.  ____________________________________________   PHYSICAL EXAM:  VITAL SIGNS: ED Triage Vitals [12/15/20 1203]  Enc Vitals Group     BP      Pulse      Resp      Temp      Temp src      SpO2      Weight      Height      Head Circumference      Peak Flow      Pain Score 10     Pain Loc      Pain Edu?      Excl. in GC?     Vital signs reviewed, nursing assessments reviewed.   Constitutional:   Alert and oriented. Non-toxic appearance. Eyes:   Conjunctivae are normal. EOMI. ENT      Head:   Normocephalic and atraumatic.  No facial erythema or swelling      Mouth/Throat:   MMM.  Poor dentition.  Slight gingival swelling at left lower jaw.  No fluctuance or purulent drainage.  No floor mouth elevation/edema      Neck:   No meningismus. Full ROM. Hematological/Lymphatic/Immunilogical:   No cervical lymphadenopathy. Cardiovascular:   RRR.  Respiratory:   Normal respiratory effort without tachypnea/retractions.  Musculoskeletal:  Normal range of motion in all extremities.  No edema. Neurologic:   Normal speech and language.  Motor grossly intact. No acute focal neurologic deficits are appreciated.  Skin:    Skin is warm, dry and intact. No rash noted.  No wounds.  ____________________________________________    LABS (pertinent positives/negatives) (all labs ordered are listed, but only abnormal results are displayed) Labs Reviewed - No data to display ____________________________________________   EKG  ____________________________________________    RADIOLOGY  No results found.  ____________________________________________   PROCEDURES Procedures  ____________________________________________  CLINICAL IMPRESSION / ASSESSMENT AND PLAN / ED COURSE  Pertinent labs & imaging results that were available during my  care of the patient were reviewed by me and considered in my medical decision making (see chart for details).  Shawn Leblanc was evaluated in Emergency Department on 12/15/2020 for the symptoms described in the history of present illness. He was evaluated in the context of the global COVID-19 pandemic, which necessitated consideration that the patient might be at risk for infection with the SARS-CoV-2 virus that causes COVID-19. Institutional protocols and algorithms that pertain to the evaluation of patients at risk for COVID-19 are in a state of rapid change based on information released by regulatory bodies including the CDC and federal and state organizations. These policies and algorithms were followed during the patient's care in the ED.   Patient presents with dental pain, gingivitis.  No drainable abscess.  Will give amoxicillin, referral to dentistry.      ____________________________________________   FINAL CLINICAL IMPRESSION(S) / ED DIAGNOSES    Final diagnoses:  Dental infection     ED Discharge Orders          Ordered    amoxicillin (AMOXIL) 500 MG tablet  2 times daily        12/15/20 1343            Portions of this note were generated with dragon dictation software. Dictation errors may occur despite best attempts at proofreading.   Sharman Cheek, MD 12/15/20 1348

## 2022-04-08 ENCOUNTER — Encounter: Payer: Self-pay | Admitting: Family

## 2022-04-08 ENCOUNTER — Ambulatory Visit: Payer: Self-pay | Admitting: Family

## 2022-04-08 VITALS — BP 134/72 | HR 72 | Ht 75.0 in | Wt 183.0 lb

## 2022-04-08 DIAGNOSIS — F410 Panic disorder [episodic paroxysmal anxiety] without agoraphobia: Secondary | ICD-10-CM

## 2022-04-08 MED ORDER — CLONAZEPAM 1 MG PO TABS: 0.5000 mg | ORAL_TABLET | Freq: Two times a day (BID) | ORAL | 3 refills | Status: DC

## 2022-04-08 NOTE — Progress Notes (Signed)
Established Patient Office Visit  Subjective:  Patient ID: Shawn Leblanc, male    DOB: 02/15/1985  Age: 37 y.o. MRN: [id number]  Chief Complaint  Patient presents with   Follow-up    3 month follow up    Anxiety Presents for follow-up visit. Symptoms include depressed mood, irritability, nervous/anxious behavior, panic and restlessness. Symptoms occur most days. The severity of symptoms is severe. The quality of sleep is good. Nighttime awakenings: occasional.   Compliance with medications is 76-100%.     Past Medical History:  Diagnosis Date   Anxiety    Umbilical hernia     No past surgical history on file.  Social History   Socioeconomic History   Marital status: Single    Spouse name: Not on file   Number of children: Not on file   Years of education: Not on file   Highest education level: Not on file  Occupational History   Not on file  Tobacco Use   Smoking status: Every Day    Packs/day: .5    Types: Cigarettes   Smokeless tobacco: Never  Substance and Sexual Activity   Alcohol use: No   Drug use: No   Sexual activity: Not on file  Other Topics Concern   Not on file  Social History Narrative   Not on file   Social Determinants of Health   Financial Resource Strain: Not on file  Food Insecurity: Not on file  Transportation Needs: Not on file  Physical Activity: Not on file  Stress: Not on file  Social Connections: Not on file  Intimate Partner Violence: Not on file    No family history on file.  No Known Allergies  Review of Systems  Constitutional:  Positive for irritability.  Psychiatric/Behavioral:  The patient is nervous/anxious.   All other systems reviewed and are negative.      Objective:   BP 134/72   Pulse 72   Ht 6\' 3"  (1.905 m)   Wt 183 lb (83 kg)   SpO2 98%   BMI 22.87 kg/m   Vitals:   04/08/22 1342  BP: 134/72  Pulse: 72  Height: 6\' 3"  (1.905 m)  Weight: 183 lb (83 kg)  SpO2: 98%  BMI (Calculated):  22.87    Physical Exam Vitals and nursing note reviewed.  Constitutional:      Appearance: Normal appearance. He is normal weight.  Eyes:     Pupils: Pupils are equal, round, and reactive to light.  Cardiovascular:     Rate and Rhythm: Normal rate and regular rhythm.     Pulses: Normal pulses.     Heart sounds: Normal heart sounds.  Pulmonary:     Effort: Pulmonary effort is normal.     Breath sounds: Normal breath sounds.  Neurological:     General: No focal deficit present.     Mental Status: He is alert and oriented to person, place, and time.  Psychiatric:        Mood and Affect: Mood normal.        Behavior: Behavior normal.      No results found for any visits on 04/08/22.  No results found for this or any previous visit (from the past 2160 hour(s)).    Assessment & Plan:   Problem List Items Addressed This Visit     Panic disorder - Primary    Return in about 3 months (around 07/09/2022).   Total time spent: 20 minutes  [place], FNP  04/08/2022 

## 2022-07-09 ENCOUNTER — Encounter: Payer: Self-pay | Admitting: Family

## 2022-07-09 ENCOUNTER — Ambulatory Visit: Payer: Self-pay | Admitting: Family

## 2022-07-09 VITALS — BP 140/80 | HR 83 | Ht 75.0 in | Wt 191.6 lb

## 2022-07-09 DIAGNOSIS — F410 Panic disorder [episodic paroxysmal anxiety] without agoraphobia: Secondary | ICD-10-CM

## 2022-07-09 DIAGNOSIS — W57XXXA Bitten or stung by nonvenomous insect and other nonvenomous arthropods, initial encounter: Secondary | ICD-10-CM

## 2022-07-09 DIAGNOSIS — S40861A Insect bite (nonvenomous) of right upper arm, initial encounter: Secondary | ICD-10-CM | POA: Insufficient documentation

## 2022-07-09 MED ORDER — CLONAZEPAM 1 MG PO TABS: 0.5000 mg | ORAL_TABLET | Freq: Two times a day (BID) | ORAL | 3 refills | Status: DC

## 2022-07-09 MED ORDER — DOXYCYCLINE HYCLATE 100 MG PO CAPS: 100.0000 mg | ORAL_CAPSULE | Freq: Two times a day (BID) | ORAL | 0 refills | Status: DC

## 2022-07-09 NOTE — Progress Notes (Signed)
Established Patient Office Visit  Subjective:  Patient ID: Shawn Leblanc, male    DOB: March 29, 1985  Age: 37 y.o. MRN: [id number]  Chief Complaint  Patient presents with   Follow-up    3 month follow up    Patient is here today for his 3 months follow up.  He has been feeling fairly well since last appointment.   He does have additional concerns to discuss today.  He has been bitten by multiple ticks over the past several weeks, but has a bite on his right arm that is most concerning to him.   Labs are not due today. He needs refills.   I have reviewed his active problem list, medication list, allergies, notes from last encounter, lab results for his appointment today.    No other concerns at this time.   Past Medical History:  Diagnosis Date   Anxiety    Umbilical hernia     History reviewed. No pertinent surgical history.  Social History   Socioeconomic History   Marital status: Single    Spouse name: Not on file   Number of children: Not on file   Years of education: Not on file   Highest education level: Not on file  Occupational History   Not on file  Tobacco Use   Smoking status: Every Day    Packs/day: .5    Types: Cigarettes   Smokeless tobacco: Never  Substance and Sexual Activity   Alcohol use: No   Drug use: No   Sexual activity: Not on file  Other Topics Concern   Not on file  Social History Narrative   Not on file   Social Determinants of Health   Financial Resource Strain: Not on file  Food Insecurity: Not on file  Transportation Needs: Not on file  Physical Activity: Not on file  Stress: Not on file  Social Connections: Not on file  Intimate Partner Violence: Not on file    History reviewed. No pertinent family history.  No Known Allergies  Review of Systems  Skin:  Positive for itching and rash.  All other systems reviewed and are negative.      Objective:   BP (!) 140/80   Pulse 83   Ht 6\' 3"  (1.905 m)   Wt 191  lb 9.6 oz (86.9 kg)   SpO2 96%   BMI 23.95 kg/m   Vitals:   07/09/22 1322  BP: (!) 140/80  Pulse: 83  Height: 6\' 3"  (1.905 m)  Weight: 191 lb 9.6 oz (86.9 kg)  SpO2: 96%  BMI (Calculated): 23.95    Physical Exam Vitals and nursing note reviewed.  Constitutional:      Appearance: Normal appearance. He is normal weight.  Eyes:     Pupils: Pupils are equal, round, and reactive to light.  Cardiovascular:     Rate and Rhythm: Normal rate and regular rhythm.     Pulses: Normal pulses.     Heart sounds: Normal heart sounds.  Pulmonary:     Effort: Pulmonary effort is normal.     Breath sounds: Normal breath sounds.  Neurological:     Mental Status: He is alert.  Psychiatric:        Mood and Affect: Mood normal.        Behavior: Behavior normal.        Thought Content: Thought content normal.        Judgment: Judgment normal.      No results found for any  visits on 07/09/22.  No results found for this or any previous visit (from the past 2160 hour(s)).     Assessment & Plan:   Problem List Items Addressed This Visit       Active Problems   Panic disorder - Primary    Patient stable.  Well controlled with current therapy.   Continue current meds.       Tick bite of right upper arm    Sending antibiotics today.  Discussed red flags for patient to call us about if he experiences: bullseye rash, fevers, headaches, or shortness of breath.         Return in about 3 months (around 10/09/2022).   Total time spent: 20 minutes  Miki Kins, FNP  07/09/2022   This document may have been prepared by [facility] Voice Recognition software and as such may include unintentional dictation errors.

## 2022-07-09 NOTE — Assessment & Plan Note (Signed)
Patient stable.  Well controlled with current therapy.   Continue current meds.  

## 2022-07-09 NOTE — Assessment & Plan Note (Signed)
Sending antibiotics today.  Discussed red flags for patient to call us about if he experiences: bullseye rash, fevers, headaches, or shortness of breath.

## 2022-07-09 NOTE — Addendum Note (Signed)
Addended by: Grayling Congress on: 07/09/2022 05:24 PM   Modules accepted: Orders

## 2022-10-08 ENCOUNTER — Ambulatory Visit: Payer: Self-pay | Admitting: Family

## 2022-10-08 ENCOUNTER — Encounter: Payer: Self-pay | Admitting: Family

## 2022-10-08 VITALS — BP 110/86 | HR 75 | Ht 75.0 in | Wt 202.4 lb

## 2022-10-08 DIAGNOSIS — F410 Panic disorder [episodic paroxysmal anxiety] without agoraphobia: Secondary | ICD-10-CM

## 2022-10-08 DIAGNOSIS — R5383 Other fatigue: Secondary | ICD-10-CM

## 2022-10-08 DIAGNOSIS — R7303 Prediabetes: Secondary | ICD-10-CM

## 2022-10-08 DIAGNOSIS — E782 Mixed hyperlipidemia: Secondary | ICD-10-CM

## 2022-10-23 ENCOUNTER — Encounter: Payer: Self-pay | Admitting: Family

## 2022-12-01 ENCOUNTER — Other Ambulatory Visit: Payer: Self-pay | Admitting: Family

## 2023-01-07 ENCOUNTER — Ambulatory Visit: Payer: Self-pay | Admitting: Family

## 2023-01-07 VITALS — BP 122/80 | HR 69 | Ht 75.0 in | Wt 215.6 lb

## 2023-01-07 DIAGNOSIS — F410 Panic disorder [episodic paroxysmal anxiety] without agoraphobia: Secondary | ICD-10-CM

## 2023-01-07 DIAGNOSIS — R7303 Prediabetes: Secondary | ICD-10-CM

## 2023-01-07 DIAGNOSIS — E782 Mixed hyperlipidemia: Secondary | ICD-10-CM

## 2023-03-27 ENCOUNTER — Encounter: Payer: Self-pay | Admitting: Family

## 2023-03-27 NOTE — Progress Notes (Signed)
 Established Patient Office Visit  Subjective:  Patient ID: Shawn Leblanc, male    DOB: 1985/08/17  Age: 38 y.o. MRN: [id number]  Chief Complaint  Patient presents with   Follow-up    3 month    Patient is here today for his 3 months follow up.  He has been feeling well since last appointment.   He does not have additional concerns to discuss today.  Labs are due today. He needs refills.   I have reviewed his active problem list, medication list, allergies, notes from last encounter, lab results for his appointment today.      No other concerns at this time.   Past Medical History:  Diagnosis Date   Anxiety    Umbilical hernia     History reviewed. No pertinent surgical history.  Social History   Socioeconomic History   Marital status: Single    Spouse name: Not on file   Number of children: Not on file   Years of education: Not on file   Highest education level: Not on file  Occupational History   Not on file  Tobacco Use   Smoking status: Every Day    Current packs/day: 0.50    Types: Cigarettes   Smokeless tobacco: Never  Substance and Sexual Activity   Alcohol use: No   Drug use: No   Sexual activity: Not on file  Other Topics Concern   Not on file  Social History Narrative   Not on file   Social Drivers of Health   Financial Resource Strain: Not on file  Food Insecurity: Not on file  Transportation Needs: Not on file  Physical Activity: Not on file  Stress: Not on file  Social Connections: Not on file  Intimate Partner Violence: Not on file    History reviewed. No pertinent family history.  No Known Allergies  Review of Systems  Psychiatric/Behavioral:  The patient is nervous/anxious.   All other systems reviewed and are negative.      Objective:   BP 122/80   Pulse 69   Ht 6\' 3"  (1.905 m)   Wt 215 lb 9.6 oz (97.8 kg)   SpO2 98%   BMI 26.95 kg/m   Vitals:   01/07/23 1259  BP: 122/80  Pulse: 69  Height: 6\' 3"   (1.905 m)  Weight: 215 lb 9.6 oz (97.8 kg)  SpO2: 98%  BMI (Calculated): 26.95    Physical Exam Vitals and nursing note reviewed.  Constitutional:      Appearance: Normal appearance. He is normal weight.  HENT:     Head: Normocephalic and atraumatic.  Eyes:     Extraocular Movements: Extraocular movements intact.     Conjunctiva/sclera: Conjunctivae normal.     Pupils: Pupils are equal, round, and reactive to light.  Cardiovascular:     Rate and Rhythm: Normal rate and regular rhythm.     Pulses: Normal pulses.     Heart sounds: Normal heart sounds.  Pulmonary:     Effort: Pulmonary effort is normal.     Breath sounds: Normal breath sounds.  Musculoskeletal:        General: Normal range of motion.     Cervical back: Normal range of motion.  Neurological:     General: No focal deficit present.     Mental Status: He is alert and oriented to person, place, and time.  Psychiatric:        Mood and Affect: Mood normal.  Behavior: Behavior normal.        Thought Content: Thought content normal.        Judgment: Judgment normal.      No results found for any visits on 01/07/23.  No results found for this or any previous visit (from the past 2160 hours).     Assessment & Plan:   Problem List Items Addressed This Visit       Other   Panic disorder - Primary   Patient stable.  Well controlled with current therapy.   Continue current meds.        Other Visit Diagnoses       Mixed hyperlipidemia       Checking labs today.  Continue current therapy for lipid control. Will modify as needed based on labwork results.     Prediabetes       A1C is in prediabetic ranges. Patient counseled on dietary choices and verbalized understanding. Will reassess at follow up after next lab check.       Return in about 3 months (around 04/07/2023).   Total time spent: 20 minutes  Miki Kins, FNP  01/07/2023   This document may have been prepared by [facility]  Voice Recognition software and as such may include unintentional dictation errors.

## 2023-03-27 NOTE — Assessment & Plan Note (Signed)
 Patient stable.  Well controlled with current therapy.   Continue current meds.

## 2023-03-31 ENCOUNTER — Other Ambulatory Visit: Payer: Self-pay

## 2023-04-01 ENCOUNTER — Other Ambulatory Visit: Payer: Self-pay | Admitting: Family

## 2023-05-09 ENCOUNTER — Ambulatory Visit: Payer: Self-pay | Admitting: Family

## 2023-05-26 ENCOUNTER — Ambulatory Visit: Payer: Self-pay | Admitting: Family

## 2023-05-26 ENCOUNTER — Encounter: Payer: Self-pay | Admitting: Family

## 2023-05-26 VITALS — BP 140/82 | HR 75 | Ht 75.0 in | Wt 228.0 lb

## 2023-05-26 DIAGNOSIS — R002 Palpitations: Secondary | ICD-10-CM

## 2023-05-26 DIAGNOSIS — Z0130 Encounter for examination of blood pressure without abnormal findings: Secondary | ICD-10-CM

## 2023-05-26 DIAGNOSIS — M25531 Pain in right wrist: Secondary | ICD-10-CM

## 2023-05-26 MED ORDER — CLONAZEPAM 1 MG PO TABS: 0.5000 mg | ORAL_TABLET | Freq: Two times a day (BID) | ORAL | 3 refills | Status: DC

## 2023-05-26 MED ORDER — AMOXICILLIN-POT CLAVULANATE 875-125 MG PO TABS: 1.0000 | ORAL_TABLET | Freq: Two times a day (BID) | ORAL | 0 refills | Status: DC

## 2023-05-26 NOTE — Progress Notes (Signed)
 Established Patient Office Visit  Subjective:  Patient ID: Shawn Leblanc, male    DOB: 12/26/1985  Age: 38 y.o. MRN: [id number]  Chief Complaint  Patient presents with   Follow-up    4 month follow up    Patient is here today for his 4 months follow up.  He has been feeling fairly well since last appointment.   He does have additional concerns to discuss today.  Says that he has been having palpitations (which he normally has, says he has his entire life), had COVID a few weeks ago, and feels like it might have gotten worse since then.  Has also had pain in his right wrist for a few weeks now, says that he had an issue when he first started having pain. He now is having pain mostly with lifting, but he says it is approximately 75% better.  Labs are not due today. He needs refills.   I have reviewed his active problem list, medication list, allergies, notes from last encounter, lab results for his appointment today.      No other concerns at this time.   Past Medical History:  Diagnosis Date   Anxiety    Tinea versicolor 01/25/2008   Umbilical hernia     History reviewed. No pertinent surgical history.  Social History   Socioeconomic History   Marital status: Single    Spouse name: Not on file   Number of children: Not on file   Years of education: Not on file   Highest education level: Not on file  Occupational History   Not on file  Tobacco Use   Smoking status: Every Day    Current packs/day: 0.50    Types: Cigarettes   Smokeless tobacco: Never  Substance and Sexual Activity   Alcohol use: No   Drug use: No   Sexual activity: Not on file  Other Topics Concern   Not on file  Social History Narrative   Not on file   Social Drivers of Health   Financial Resource Strain: Not on file  Food Insecurity: Not on file  Transportation Needs: Not on file  Physical Activity: Not on file  Stress: Not on file  Social Connections: Not on file  Intimate  Partner Violence: Not on file    History reviewed. No pertinent family history.  No Known Allergies  Review of Systems  Cardiovascular:  Positive for palpitations.  All other systems reviewed and are negative.      Objective:   BP (!) 140/82   Pulse 75   Ht 6\' 3"  (1.905 m)   Wt 228 lb (103.4 kg)   SpO2 98%   BMI 28.50 kg/m   Vitals:   05/26/23 1030  BP: (!) 140/82  Pulse: 75  Height: 6\' 3"  (1.905 m)  Weight: 228 lb (103.4 kg)  SpO2: 98%  BMI (Calculated): 28.5    Physical Exam Vitals and nursing note reviewed.  Constitutional:      Appearance: Normal appearance. He is normal weight.  Eyes:     Extraocular Movements: Extraocular movements intact.     Conjunctiva/sclera: Conjunctivae normal.     Pupils: Pupils are equal, round, and reactive to light.  Cardiovascular:     Rate and Rhythm: Normal rate and regular rhythm.     Pulses: Normal pulses.     Heart sounds: Normal heart sounds.  Pulmonary:     Effort: Pulmonary effort is normal.     Breath sounds: Normal breath sounds.  Neurological:     General: No focal deficit present.     Mental Status: He is alert and oriented to person, place, and time. Mental status is at baseline.  Psychiatric:        Mood and Affect: Mood normal.        Behavior: Behavior normal.        Thought Content: Thought content normal.        Judgment: Judgment normal.      No results found for any visits on 05/26/23.  No results found for this or any previous visit (from the past 2160 hours).     Assessment & Plan:   Problem List Items Addressed This Visit   None Visit Diagnoses       Palpitations    -  Primary   Patient will let me know if he continues to have more issues post COVID.  Considering metoprolol if he does, concerned for pulse.   Relevant Orders   EKG 12-Lead     Wrist pain, right       XR right wrist ordered, scheduled for 5/13 per pt request. Will let patient know results when available.   Relevant  Orders   DG Wrist Complete Right       Return in about 4 months (around 09/26/2023).   Total time spent: 20 minutes  Trenda Frisk, FNP  05/26/2023   This document may have been prepared by [facility] Voice Recognition software and as such may include unintentional dictation errors.

## 2023-06-07 ENCOUNTER — Other Ambulatory Visit: Payer: Self-pay

## 2023-09-27 ENCOUNTER — Ambulatory Visit: Payer: Self-pay | Admitting: Family

## 2023-09-27 ENCOUNTER — Encounter: Payer: Self-pay | Admitting: Family

## 2023-09-27 VITALS — BP 142/90 | HR 83 | Ht 75.0 in | Wt 235.8 lb

## 2023-09-27 DIAGNOSIS — Z79899 Other long term (current) drug therapy: Secondary | ICD-10-CM

## 2023-09-27 DIAGNOSIS — F410 Panic disorder [episodic paroxysmal anxiety] without agoraphobia: Secondary | ICD-10-CM

## 2023-09-27 NOTE — Progress Notes (Unsigned)
 Established Patient Office Visit  Subjective:  Patient ID: Shawn Leblanc, male    DOB: 1985-07-21  Age: 38 y.o. MRN: [id number]  Chief Complaint  Patient presents with   Follow-up    4 month follow up    Patient is here today for his 3 months follow up.  He has been feeling fairly well since last appointment.   He does have additional concerns to discuss today.  He has been having some additional spikes in his anxiety due to the recent passing of his MIL.  He says that this has increased the stress on his wife which has caused him more stress as well.   Labs are due today.  He needs refills.   I have reviewed his active problem list, medication list, allergies, notes from last encounter, lab results for his appointment today.      No other concerns at this time.   Past Medical History:  Diagnosis Date   Anxiety    Tinea versicolor 01/25/2008   Umbilical hernia     No past surgical history on file.  Social History   Socioeconomic History   Marital status: Single    Spouse name: Not on file   Number of children: Not on file   Years of education: Not on file   Highest education level: Not on file  Occupational History   Not on file  Tobacco Use   Smoking status: Every Day    Current packs/day: 0.50    Types: Cigarettes   Smokeless tobacco: Never  Substance and Sexual Activity   Alcohol use: No   Drug use: No   Sexual activity: Not on file  Other Topics Concern   Not on file  Social History Narrative   Not on file   Social Drivers of Health   Financial Resource Strain: Not on file  Food Insecurity: Not on file  Transportation Needs: Not on file  Physical Activity: Not on file  Stress: Not on file  Social Connections: Not on file  Intimate Partner Violence: Not on file    No family history on file.  No Known Allergies  Review of Systems  All other systems reviewed and are negative.      Objective:   BP (!) 142/90   Pulse 83   Ht  6' 3 (1.905 m)   Wt 235 lb 12.8 oz (107 kg)   SpO2 98%   BMI 29.47 kg/m   Vitals:   09/27/23 1042  BP: (!) 142/90  Pulse: 83  Height: 6' 3 (1.905 m)  Weight: 235 lb 12.8 oz (107 kg)  SpO2: 98%  BMI (Calculated): 29.47    Physical Exam Vitals and nursing note reviewed.  Constitutional:      Appearance: Normal appearance. He is normal weight.  Eyes:     Pupils: Pupils are equal, round, and reactive to light.  Cardiovascular:     Rate and Rhythm: Normal rate and regular rhythm.     Pulses: Normal pulses.     Heart sounds: Normal heart sounds.  Pulmonary:     Effort: Pulmonary effort is normal.     Breath sounds: Normal breath sounds.  Neurological:     Mental Status: He is alert.  Psychiatric:        Mood and Affect: Mood normal.        Behavior: Behavior normal.      No results found for any visits on 09/27/23.  No results found for this or any  previous visit (from the past 2160 hours).     Assessment & Plan Panic disorder Long-term current use of benzodiazepine UDS ordered today.  Patient stable.  Well controlled with current therapy.   Continue current meds.     No follow-ups on file.   Total time spent: 20 minutes  ALAN CHRISTELLA ARRANT, FNP  09/27/2023   This document may have been prepared by [facility] Voice Recognition software and as such may include unintentional dictation errors.

## 2023-09-29 MED ORDER — CLONAZEPAM 1 MG PO TABS: 0.5000 mg | ORAL_TABLET | Freq: Two times a day (BID) | ORAL | 3 refills | Status: DC | PRN

## 2023-09-29 NOTE — Assessment & Plan Note (Signed)
 UDS ordered today.  Patient stable.  Well controlled with current therapy.   Continue current meds.

## 2023-12-27 ENCOUNTER — Ambulatory Visit: Payer: Self-pay | Admitting: Family

## 2023-12-27 ENCOUNTER — Encounter: Payer: Self-pay | Admitting: Family

## 2023-12-27 VITALS — BP 136/82 | HR 85 | Ht 75.0 in | Wt 233.4 lb

## 2023-12-27 DIAGNOSIS — Z79899 Other long term (current) drug therapy: Secondary | ICD-10-CM

## 2023-12-27 DIAGNOSIS — Z0130 Encounter for examination of blood pressure without abnormal findings: Secondary | ICD-10-CM

## 2023-12-27 DIAGNOSIS — F410 Panic disorder [episodic paroxysmal anxiety] without agoraphobia: Secondary | ICD-10-CM

## 2023-12-27 DIAGNOSIS — E782 Mixed hyperlipidemia: Secondary | ICD-10-CM

## 2023-12-27 MED ORDER — CLONAZEPAM 1 MG PO TABS: 0.5000 mg | ORAL_TABLET | Freq: Two times a day (BID) | ORAL | 5 refills | Status: AC | PRN

## 2023-12-27 NOTE — Progress Notes (Signed)
 Established Patient Office Visit  Subjective:  Patient ID: Shawn Leblanc, male    DOB: 1985-03-29  Age: 38 y.o. MRN: [id number]  Chief Complaint  Patient presents with   Follow-up    3 month follow up    Patient is here today for his 4 months follow up.  He has been feeling well since last appointment.   He does not have additional concerns to discuss today.  Labs are due today.  He needs refills.   I have reviewed his active problem list, medication list, allergies, notes from last encounter, lab results for his appointment today.      No other concerns at this time.   Past Medical History:  Diagnosis Date   Anxiety    Tinea versicolor 01/25/2008   Umbilical hernia     No past surgical history on file.  Social History   Socioeconomic History   Marital status: Single    Spouse name: Not on file   Number of children: Not on file   Years of education: Not on file   Highest education level: Not on file  Occupational History   Not on file  Tobacco Use   Smoking status: Every Day    Current packs/day: 0.50    Types: Cigarettes   Smokeless tobacco: Never  Substance and Sexual Activity   Alcohol use: No   Drug use: No   Sexual activity: Not on file  Other Topics Concern   Not on file  Social History Narrative   Not on file   Social Drivers of Health   Financial Resource Strain: Not on file  Food Insecurity: Not on file  Transportation Needs: Not on file  Physical Activity: Not on file  Stress: Not on file  Social Connections: Not on file  Intimate Partner Violence: Not on file    No family history on file.  No Known Allergies  Review of Systems  All other systems reviewed and are negative.      Objective:   BP 136/82   Pulse 85   Ht 6' 3 (1.905 m)   Wt 233 lb 6.4 oz (105.9 kg)   SpO2 98%   BMI 29.17 kg/m   Vitals:   12/27/23 1053  BP: 136/82  Pulse: 85  Height: 6' 3 (1.905 m)  Weight: 233 lb 6.4 oz (105.9 kg)  SpO2: 98%   BMI (Calculated): 29.17    Physical Exam Vitals and nursing note reviewed.  Constitutional:      Appearance: Normal appearance. He is normal weight.  Eyes:     Pupils: Pupils are equal, round, and reactive to light.  Cardiovascular:     Rate and Rhythm: Normal rate and regular rhythm.     Pulses: Normal pulses.     Heart sounds: Normal heart sounds.  Pulmonary:     Effort: Pulmonary effort is normal.     Breath sounds: Normal breath sounds.  Neurological:     General: No focal deficit present.     Mental Status: He is alert and oriented to person, place, and time. Mental status is at baseline.  Psychiatric:        Mood and Affect: Mood normal.        Behavior: Behavior normal.        Thought Content: Thought content normal.        Judgment: Judgment normal.     No results found for any visits on 12/27/23.  No results found for this or any  previous visit (from the past 2160 hours).     Assessment & Plan Panic disorder Long-term current use of benzodiazepine Patient stable.  Well controlled with current therapy.   Continue current meds.   Mixed hyperlipidemia Checking labs today.  Continue current therapy for lipid control. Will modify as needed based on labwork results.   -BMP -Lipid Panel     Return in about 6 months (around 06/26/2024).   Total time spent: 20 minutes  ALAN CHRISTELLA ARRANT, FNP  12/27/2023   This document may have been prepared by [facility] Voice Recognition software and as such may include unintentional dictation errors.

## 2023-12-28 LAB — LIPID PANEL W/O CHOL/HDL RATIO
Cholesterol, Total: 152 mg/dL (ref 100–199)
HDL: 39 mg/dL — ABNORMAL LOW (ref >39)
LDL Chol Calc (NIH): 98 mg/dL (ref 0–99)
Triglycerides: 78 mg/dL (ref 0–149)
VLDL Cholesterol Cal: 15 mg/dL (ref 5–40)

## 2023-12-28 LAB — CBC
Hematocrit: 50.1 % (ref 37.5–51.0)
Hemoglobin: 16.7 g/dL (ref 13.0–17.7)
MCH: 30.9 pg (ref 26.6–33.0)
MCHC: 33.3 g/dL (ref 31.5–35.7)
MCV: 93 fL (ref 79–97)
Platelets: 285 x10E3/uL (ref 150–450)
RBC: 5.4 x10E6/uL (ref 4.14–5.80)
RDW: 12.7 % (ref 11.6–15.4)
WBC: 9.1 x10E3/uL (ref 3.4–10.8)

## 2023-12-28 LAB — BASIC METABOLIC PANEL WITH GFR
BUN/Creatinine Ratio: 12 (ref 9–20)
BUN: 14 mg/dL (ref 6–24)
CO2: 23 mmol/L (ref 20–29)
Calcium: 10.1 mg/dL (ref 8.7–10.2)
Chloride: 102 mmol/L (ref 96–106)
Creatinine, Ser: 1.15 mg/dL (ref 0.76–1.27)
Glucose: 84 mg/dL (ref 70–99)
Potassium: 4 mmol/L (ref 3.5–5.2)
Sodium: 141 mmol/L (ref 134–144)
eGFR: 83 mL/min/1.73 (ref >59)

## 2024-01-01 ENCOUNTER — Encounter: Payer: Self-pay | Admitting: Family

## 2024-01-01 NOTE — Assessment & Plan Note (Signed)
 Patient stable.  Well controlled with current therapy.   Continue current meds.

## 2024-01-12 ENCOUNTER — Ambulatory Visit: Payer: Self-pay

## 2024-01-18 ENCOUNTER — Other Ambulatory Visit: Payer: Self-pay | Admitting: Family

## 2024-06-26 ENCOUNTER — Ambulatory Visit: Payer: Self-pay | Admitting: Family
